# Patient Record
Sex: Female | Born: 1995 | Race: Black or African American | Hispanic: No | Marital: Single | Smoking: Former smoker
Health system: Southern US, Community
[De-identification: ages and names within clinical notes are randomized; demographics above are authoritative.]

## PROBLEM LIST (undated history)

## (undated) DIAGNOSIS — S83200A Bucket-handle tear of unspecified meniscus, current injury, right knee, initial encounter: Secondary | ICD-10-CM

## (undated) DIAGNOSIS — S83004A Unspecified dislocation of right patella, initial encounter: Secondary | ICD-10-CM

## (undated) DIAGNOSIS — S83511A Sprain of anterior cruciate ligament of right knee, initial encounter: Secondary | ICD-10-CM

## (undated) DIAGNOSIS — J45909 Unspecified asthma, uncomplicated: Secondary | ICD-10-CM

## (undated) DIAGNOSIS — T7840XA Allergy, unspecified, initial encounter: Secondary | ICD-10-CM

## (undated) HISTORY — DX: Allergy, unspecified, initial encounter: T78.40XA

---
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
## 2017-08-29 ENCOUNTER — Encounter (HOSPITAL_COMMUNITY): Payer: Self-pay | Admitting: *Deleted

## 2017-08-29 ENCOUNTER — Emergency Department (HOSPITAL_COMMUNITY)
Admission: EM | Admit: 2017-08-29 | Discharge: 2017-08-29 | Disposition: A | Discharge status: Home / Self Care | Payer: Self-pay | Attending: Emergency Medicine | Admitting: Emergency Medicine

## 2017-08-29 DIAGNOSIS — T148XXA Other injury of unspecified body region, initial encounter: Secondary | ICD-10-CM | POA: Insufficient documentation

## 2017-08-29 DIAGNOSIS — Y92009 Unspecified place in unspecified non-institutional (private) residence as the place of occurrence of the external cause: Secondary | ICD-10-CM | POA: Insufficient documentation

## 2017-08-29 DIAGNOSIS — Z79899 Other long term (current) drug therapy: Secondary | ICD-10-CM | POA: Insufficient documentation

## 2017-08-29 DIAGNOSIS — Z7722 Contact with and (suspected) exposure to environmental tobacco smoke (acute) (chronic): Secondary | ICD-10-CM | POA: Insufficient documentation

## 2017-08-29 DIAGNOSIS — Y999 Unspecified external cause status: Secondary | ICD-10-CM | POA: Insufficient documentation

## 2017-08-29 DIAGNOSIS — X501XXA Overexertion from prolonged static or awkward postures, initial encounter: Secondary | ICD-10-CM | POA: Insufficient documentation

## 2017-08-29 DIAGNOSIS — Y9384 Activity, sleeping: Secondary | ICD-10-CM | POA: Insufficient documentation

## 2017-08-29 DIAGNOSIS — J45909 Unspecified asthma, uncomplicated: Secondary | ICD-10-CM | POA: Insufficient documentation

## 2017-08-29 MED ORDER — IBUPROFEN 800 MG PO TABS
800.0000 mg | ORAL_TABLET | Freq: Once | ORAL | Status: AC
  Administered 2017-08-29: 800 mg via ORAL
  Filled 2017-08-29: qty 1

## 2017-08-29 MED ORDER — IBUPROFEN 600 MG PO TABS: 600.0000 mg | ORAL_TABLET | Freq: Three times a day (TID) | ORAL | 0 refills | Status: DC

## 2017-08-29 NOTE — ED Triage Notes (Signed)
Pt points to right collarbone area when asked where pain is.  Started last night.

## 2017-08-29 NOTE — ED Provider Notes (Signed)
[facility] EMERGENCY DEPARTMENT Provider Note   CSN: [id number] Arrival date & time: 08/29/17  1040     History   Chief Complaint Chief Complaint  Patient presents with  . Neck Pain    HPI Kelsey Abbott is a 22 y.o. female presenting with right clavicle pain which she woke with this am.  She reports sleeping on the couch last night and had soreness upon waking.  Her pain is gone at rest, worsens with palpation and with stretching her neck and opening her jaw.  She denies injury, has had no fevers, chills, sore throat, posterior neck or shoulder pain and denies sob or chest pain. She has had no treatment prior to arrival.  The history is provided by the patient.    Past Medical History:  Diagnosis Date  . Asthma    mother says asthma was ruled  out.  . Bucket-handle tear of meniscus of right knee as current injury 04/17/2016  . Closed dislocation of right patella 04/17/2016  . Complete tear of right ACL 04/17/2016    Patient Active Problem List   Diagnosis Date Noted  . Complete tear of right ACL 04/17/2016  . Closed dislocation of right patella 04/17/2016  . Bucket-handle tear of meniscus of right knee as current injury 04/17/2016  . Right ACL tear 04/17/2016  . Acne vulgaris 09/29/2014  . Acute upper respiratory infection 07/20/2014  . Panic attacks 04/25/2013  . SOB (shortness of breath) 03/23/2013  . 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 Care involving breathing exercises 03/23/2013  . Asthma, chronic 11/03/2012  . FLAT FOOT 07/05/2007    Past Surgical History:  Procedure Laterality Date  . KNEE ARTHROSCOPY WITH ANTERIOR CRUCIATE LIGAMENT (ACL) REPAIR WITH HAMSTRING GRAFT Right 04/17/2016   Procedure: Right KNEE ARTHROSCOPY WITH ANTERIOR CRUCIATE LIGAMENT (ACL) REPAIR WITH HAMSTRING GRAFT;  Surgeon: Teryl LucyJoshua Landau, MD;  Location: [facility];  Service: Orthopedics;  Laterality: Right;  Pre/Post Op femoral nerve block  . KNEE ARTHROSCOPY WITH MEDIAL  MENISECTOMY Right 04/17/2016   Procedure: KNEE ARTHROSCOPY WITH PARTIAL MEDIAL MENISECTOMY;  Surgeon: Teryl LucyJoshua Landau, MD;  Location: [facility];  Service: Orthopedics;  Laterality: Right;  . KNEE ARTHROSCOPY WITH MEDIAL PATELLAR FEMORAL LIGAMENT RECONSTRUCTION Right 04/17/2016   Procedure: KNEE ARTHROSCOPY WITH MEDIAL PATELLAR FEMORAL LIGAMENT REEFING;  Surgeon: Teryl LucyJoshua Landau, MD;  [facility];  Service: Orthopedics;  Laterality: Right;    OB History    No data available       Home Medications    Prior to Admission medications   Medication Sig Start Date End Date Taking? Authorizing Provider  baclofen (LIORESAL) 10 MG tablet Take 1 tablet (10 mg total) by mouth 3 (three) times daily. As needed for muscle spasm 04/17/16   Teryl LucyLandau, Joshua, MD  ibuprofen (ADVIL,MOTRIN) 600 MG tablet Take 1 tablet (600 mg total) by mouth 3 (three) times daily. 08/29/17   Burgess AmorIdol, Deandrea Rion, PA-C  norethindrone-ethinyl estradiol (CYCLAFEM,ALYACEN) 0.5/0.75/1-35 MG-MCG tablet Take 1 tablet by mouth daily.    [provider]  ondansetron (ZOFRAN) 4 MG tablet Take 1 tablet (4 mg total) by mouth every 8 (eight) hours as needed for nausea or vomiting. 04/17/16   Teryl LucyLandau, Joshua, MD  oxyCODONE-acetaminophen (ROXICET) 5-325 MG tablet Take 1-2 tablets by mouth every 6 (six) hours as needed for severe pain. 04/17/16   Teryl LucyLandau, Joshua, MD  sennosides-docusate sodium (SENOKOT-S) 8.6-50 MG tablet Take 2 tablets by mouth daily. 04/17/16   Teryl LucyLandau, Joshua, MD  Family History Family History  Problem Relation Age of Onset  . Hypertension Maternal Grandmother   . Hypertension Maternal Aunt     Social History Social History   Tobacco Use  . Smoking status: Passive Smoke Exposure - Never Smoker  . Smokeless tobacco: Never Used  Substance Use Topics  . Alcohol use: No    Comment: minor  . Drug use: No     Allergies   Patient has no known allergies.   Review of Systems Review of  Systems  Constitutional: Negative for fever.  Respiratory: Negative.   Cardiovascular: Negative.   Musculoskeletal: Positive for arthralgias. Negative for joint swelling, myalgias, neck pain and neck stiffness.  Skin: Negative for color change and wound.  Neurological: Negative for weakness and numbness.     Physical Exam Updated Vital Signs BP 116/77 (BP Location: Right Arm)   Pulse 71   Temp 97.9 F (36.6 C) (Oral)   Resp 18   Ht 5\' 4"  (1.626 m)   Wt 68 kg (150 lb)   LMP 08/28/2017   SpO2 100%   BMI 25.75 kg/m   Physical Exam  Constitutional: She appears well-developed and well-nourished.  HENT:  Head: Atraumatic.  Mouth/Throat: Uvula is midline and oropharynx is clear and moist. No trismus in the jaw. Normal dentition. No uvula swelling.  Sublingual space is soft  Neck: Normal range of motion.  Cardiovascular:  Pulses equal bilaterally  Pulmonary/Chest:  Point tender to palpation mid right clavicle.  There is no deformity, edema, erythema or any sign of trauma.  Patient has full range of motion of her shoulders without pain.  She has mild discomfort with full neck extension at the site.  There is no head or neck neck adenopathy.  Sublingual space is soft.    Musculoskeletal: She exhibits tenderness.  Neurological: She is alert. She has normal strength. She displays normal reflexes. No sensory deficit.  Skin: Skin is warm and dry.  Psychiatric: She has a normal mood and affect.     ED Treatments / Results  Labs (all labs ordered are listed, but only abnormal results are displayed) Labs Reviewed - No data to display  EKG  EKG Interpretation None       Radiology No results found.  Procedures Procedures (including critical care time)  Medications Ordered in ED Medications  ibuprofen (ADVIL,MOTRIN) tablet 800 mg (800 mg Oral Given 08/29/17 1126)     Initial Impression / Assessment and Plan / ED Course  I have reviewed the triage vital signs and the  nursing notes.  Pertinent labs & imaging results that were available during my care of the patient were reviewed by me and considered in my medical decision making (see chart for details).     Patient without injury, suspect she may have mild muscle strain possibly from sleeping awkwardly on the couch last night.  She has no exam findings to suggest infection, trauma.  She was advised heat therapy, ibuprofen, PRN follow-up with PCP if symptoms persist or worsen.  Final Clinical Impressions(s) / ED Diagnoses   Final diagnoses:  Muscle strain    ED Discharge Orders        Ordered    ibuprofen (ADVIL,MOTRIN) 600 MG tablet  3 times daily     08/29/17 1115       Burgess Amor, PA-C 08/29/17 1618    Mesner, Barbara Cower, MD 08/29/17 [phone]

## 2017-08-29 NOTE — Discharge Instructions (Signed)
Apply heating pad to your right neck in collar bone for 20 minutes several times daily.  Take the ibuprofen as prescribed.  I suspect you have strained a muscle in your neck, possibly from sleeping on the couch last night or from wearing your new pocket book over that shoulder.  Avoid this activity.  Plan to get rechecked if your symptoms worsen, but I suspect that this will resolve over the next several days.

## 2017-08-29 NOTE — ED Notes (Signed)
Patient with order to be given Ibuprofren 800mg  PO, patient stated she had already taken one 800 mg Advil brought from home.  Previous order not given.

## 2018-02-04 ENCOUNTER — Encounter (HOSPITAL_COMMUNITY): Payer: Self-pay | Admitting: *Deleted

## 2018-02-04 ENCOUNTER — Emergency Department (HOSPITAL_COMMUNITY)
Admission: EM | Admit: 2018-02-04 | Discharge: 2018-02-04 | Disposition: A | Discharge status: Home / Self Care | Payer: Self-pay | Attending: Emergency Medicine | Admitting: Emergency Medicine

## 2018-02-04 ENCOUNTER — Other Ambulatory Visit: Payer: Self-pay

## 2018-02-04 DIAGNOSIS — Z7722 Contact with and (suspected) exposure to environmental tobacco smoke (acute) (chronic): Secondary | ICD-10-CM | POA: Insufficient documentation

## 2018-02-04 DIAGNOSIS — R51 Headache: Secondary | ICD-10-CM | POA: Insufficient documentation

## 2018-02-04 DIAGNOSIS — R519 Headache, unspecified: Secondary | ICD-10-CM

## 2018-02-04 DIAGNOSIS — Z79899 Other long term (current) drug therapy: Secondary | ICD-10-CM | POA: Insufficient documentation

## 2018-02-04 MED ORDER — METOCLOPRAMIDE HCL 5 MG/ML IJ SOLN
10.0000 mg | Freq: Once | INTRAMUSCULAR | Status: AC
  Administered 2018-02-04: 10 mg via INTRAVENOUS
  Filled 2018-02-04: qty 2

## 2018-02-04 MED ORDER — BUTALBITAL-APAP-CAFFEINE 50-325-40 MG PO TABS: 1.0000 | ORAL_TABLET | Freq: Four times a day (QID) | ORAL | 0 refills | Status: AC | PRN

## 2018-02-04 MED ORDER — KETOROLAC TROMETHAMINE 30 MG/ML IJ SOLN
15.0000 mg | Freq: Once | INTRAMUSCULAR | Status: AC
  Administered 2018-02-04: 15 mg via INTRAVENOUS
  Filled 2018-02-04: qty 1

## 2018-02-04 MED ORDER — SODIUM CHLORIDE 0.9 % IV BOLUS
1000.0000 mL | Freq: Once | INTRAVENOUS | Status: AC
  Administered 2018-02-04: 1000 mL via INTRAVENOUS

## 2018-02-04 MED ORDER — DIPHENHYDRAMINE HCL 50 MG/ML IJ SOLN
25.0000 mg | Freq: Once | INTRAMUSCULAR | Status: AC
  Administered 2018-02-04: 25 mg via INTRAVENOUS
  Filled 2018-02-04: qty 1
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
## 2018-02-04 NOTE — ED Triage Notes (Signed)
Pt c/o right side headache x 1 week with some nausea; pt states she has a lump to that right shoulder

## 2018-02-16 ENCOUNTER — Encounter: Payer: Self-pay | Admitting: Women's Health

## 2018-02-16 ENCOUNTER — Ambulatory Visit (INDEPENDENT_AMBULATORY_CARE_PROVIDER_SITE_OTHER): Payer: Medicaid Other | Admitting: Women's Health

## 2018-02-16 ENCOUNTER — Other Ambulatory Visit: Payer: Self-pay

## 2018-02-16 VITALS — BP 131/80 | HR 86 | Ht 64.0 in | Wt 160.0 lb

## 2018-02-16 DIAGNOSIS — Z309 Encounter for contraceptive management, unspecified: Secondary | ICD-10-CM | POA: Diagnosis not present

## 2018-02-16 DIAGNOSIS — Z3202 Encounter for pregnancy test, result negative: Secondary | ICD-10-CM

## 2018-02-16 DIAGNOSIS — Z3009 Encounter for other general counseling and advice on contraception: Secondary | ICD-10-CM | POA: Diagnosis not present

## 2018-02-16 DIAGNOSIS — Z01419 Encounter for gynecological examination (general) (routine) without abnormal findings: Secondary | ICD-10-CM

## 2018-02-16 DIAGNOSIS — Z113 Encounter for screening for infections with a predominantly sexual mode of transmission: Secondary | ICD-10-CM

## 2018-02-16 LAB — POCT URINE PREGNANCY: PREG TEST UR: NEGATIVE

## 2018-02-16 MED ORDER — LO LOESTRIN FE 1 MG-10 MCG / 10 MCG PO TABS: 1.0000 | ORAL_TABLET | Freq: Every day | ORAL | 3 refills | Status: DC

## 2018-02-16 NOTE — Progress Notes (Signed)
   WELL-WOMAN EXAMINATION Patient name: Kelsey Abbott MRN [id number]  Date of birth: 01/13/1996 Chief Complaint:   Gynecologic Exam  History of Present Illness:   Kelsey Abbott is a 22 y.o. G0P0000 African American female being seen today for a routine FP Mcaid well-woman exam. Was originally here to get on birth control, but had to have physical w/ insurance.  Current complaints: had a period that was really crampy and passed a big clot, has been having unprotected sex, did not take a HPT. Wants to get back on COCs. Does not smoke, no h/o HTN, DVT/PE, CVA, MI, or migraines w/ aura.   PCP: none, was [facility]    Patient's last menstrual period was 02/13/2018. The current method of family planning is none and wants COCs Last pap <21yo. Results were: n/a Last mammogram: never. Results were: n/a Last colonoscopy: never. Results were: n/a  Review of Systems:   Pertinent items are noted in HPI Denies any headaches, blurred vision, fatigue, shortness of breath, chest pain, abdominal pain, abnormal vaginal discharge/itching/odor/irritation, problems with periods, bowel movements, urination, or intercourse unless otherwise stated above. Pertinent History Reviewed:  Reviewed past medical,surgical, social and family history.  Reviewed problem list, medications and allergies. Physical Assessment:   Vitals:   02/16/18 1051  BP: 131/80  Pulse: 86  Weight: 160 lb (72.6 kg)  Height: 5\' 4"  (1.626 m)  Body mass index is 27.46 kg/m.        Physical Examination:   General appearance - well appearing, and in no distress  Mental status - alert, oriented to person, place, and time  Psych:  She has a normal mood and affect  Skin - warm and dry, normal color, no suspicious lesions noted  Chest - effort normal, all lung fields clear to auscultation bilaterally  Heart - normal rate and regular rhythm  Neck:  midline trachea, no thyromegaly or nodules  Breasts - not assessed, denies  problems  Abdomen - soft, nontender, nondistended, no masses or organomegaly  Pelvic - VULVA: normal appearing vulva with no masses, tenderness or lesions  VAGINA: normal appearing vagina with normal color and discharge, no lesions  CERVIX: normal appearing cervix without discharge or lesions, no CMT  Thin prep pap is not done  UTERUS: uterus is felt to be normal size, shape, consistency and nontender   ADNEXA: No adnexal masses or tenderness noted.  Extremities:  No swelling or varicosities noted  Results for orders placed or performed in visit on 02/16/18 (from the past 24 hour(s))  POCT urine pregnancy   Collection Time: 02/16/18 11:27 AM  Result Value Ref Range   Preg Test, Ur Negative Negative    Assessment & Plan:  1) FP Mcaid Well-Woman Exam  2) Contraception management> rx LoLoestrin 3pk w/ 3RF, condoms always, f/u 3mths  3) STD screen> gc/ct, hiv, rpr  Labs/procedures today: std screen  Mammogram @20yo  or sooner if problems Colonoscopy @45 -50yo or sooner if problems  Orders Placed This Encounter  Procedures  . GC/Chlamydia Probe Amp  . HIV antibody  . RPR  . POCT urine pregnancy    Follow-up: Return in about 3 months (around 05/19/2018) for F/U.  Cheral MarkerKimberly R Leimomi Zervas CNM, [facility]NP-BC 02/16/2018 11:28 AM

## 2018-02-16 NOTE — Patient Instructions (Signed)
Condoms always!  Oral Contraception Use Oral contraceptive pills (OCPs) are medicines taken to prevent pregnancy. OCPs work by preventing the ovaries from releasing eggs. The hormones in OCPs also cause the cervical mucus to thicken, preventing the sperm from entering the uterus. The hormones also cause the uterine lining to become thin, not allowing a fertilized egg to attach to the inside of the uterus. OCPs are highly effective when taken exactly as prescribed. However, OCPs do not prevent sexually transmitted diseases (STDs). Safe sex practices, such as using condoms along with an OCP, can help prevent STDs. Before taking OCPs, you may have a physical exam and Pap test. Your health care provider may also order blood tests if necessary. Your health care provider will make sure you are a good candidate for oral contraception. Discuss with your health care provider the possible side effects of the OCP you may be prescribed. When starting an OCP, it can take 2 to 3 months for the body to adjust to the changes in hormone levels in your body. How to take oral contraceptive pills Your health care provider may advise you on how to start taking the first cycle of OCPs. Otherwise, you can:  Start on day 1 of your menstrual period. You will not need any backup contraceptive protection with this start time.  Start on the first Sunday after your menstrual period or the day you get your prescription. In these cases, you will need to use backup contraceptive protection for the first week.  Start the pill at any time of your cycle. If you take the pill within 5 days of the start of your period, you are protected against pregnancy right away. In this case, you will not need a backup form of birth control. If you start at any other time of your menstrual cycle, you will need to use another form of birth control for 7 days. If your OCP is the type called a minipill, it will protect you from pregnancy after taking it  for 2 days (48 hours).  After you have started taking OCPs:  If you forget to take 1 pill, take it as soon as you remember. Take the next pill at the regular time.  If you miss 2 or more pills, call your health care provider because different pills have different instructions for missed doses. Use backup birth control until your next menstrual period starts.  If you use a 28-day pack that contains inactive pills and you miss 1 of the last 7 pills (pills with no hormones), it will not matter. Throw away the rest of the non-hormone pills and start a new pill pack.  No matter which day you start the OCP, you will always start a new pack on that same day of the week. Have an extra pack of OCPs and a backup contraceptive method available in case you miss some pills or lose your OCP pack. Follow these instructions at home:  Do not smoke.  Always use a condom to protect against STDs. OCPs do not protect against STDs.  Use a calendar to mark your menstrual period days.  Read the information and directions that came with your OCP. Talk to your health care provider if you have questions. Contact a health care provider if:  You develop nausea and vomiting.  You have abnormal vaginal discharge or bleeding.  You develop a rash.  You miss your menstrual period.  You are losing your hair.  You need treatment for mood swings or  depression.  You get dizzy when taking the OCP.  You develop acne from taking the OCP.  You become pregnant. Get help right away if:  You develop chest pain.  You develop shortness of breath.  You have an uncontrolled or severe headache.  You develop numbness or slurred speech.  You develop visual problems.  You develop pain, redness, and swelling in the legs. This information is not intended to replace advice given to you by your health care provider. Make sure you discuss any questions you have with your health care provider. Document Released:  07/17/2011 Document Revised: 01/03/2016 Document Reviewed: 01/16/2013 Elsevier Interactive Patient Education  2017 [employer]

## 2018-02-17 LAB — RPR: RPR Ser Ql: NONREACTIVE

## 2018-02-17 LAB — HIV ANTIBODY (ROUTINE TESTING W REFLEX): HIV SCREEN 4TH GENERATION: NONREACTIVE

## 2018-02-18 LAB — GC/CHLAMYDIA PROBE AMP
Chlamydia trachomatis, NAA: NEGATIVE
NEISSERIA GONORRHOEAE BY PCR: NEGATIVE

## 2018-05-20 ENCOUNTER — Ambulatory Visit: Payer: Medicaid Other | Admitting: Women's Health

## 2018-05-27 ENCOUNTER — Ambulatory Visit (INDEPENDENT_AMBULATORY_CARE_PROVIDER_SITE_OTHER): Payer: Medicaid Other | Admitting: Women's Health

## 2018-05-27 ENCOUNTER — Encounter: Payer: Self-pay | Admitting: Women's Health

## 2018-05-27 VITALS — BP 119/74 | HR 76 | Ht 63.0 in | Wt 168.8 lb

## 2018-05-27 DIAGNOSIS — Z3041 Encounter for surveillance of contraceptive pills: Secondary | ICD-10-CM

## 2018-05-27 NOTE — Progress Notes (Signed)
   GYN VISIT Patient name: Kelsey Abbott MRN [id number]  Date of birth: 1996-01-05 Chief Complaint:   3 months follow-up (Lo Loestrin FE)  History of Present Illness:   Kelsey Abbott is a 22 y.o. G0P0000 African American female being seen today for f/u on LoLoestrin rx'd 02/16/18. Doing well, remembering to take pills, has normal period at end of pack. No problems/concerns.     Patient's last menstrual period was 05/23/2018. The current method of family planning is OCP (estrogen/progesterone). Last pap <21yo. Results were:  n/a Review of Systems:   Pertinent items are noted in HPI Denies fever/chills, dizziness, headaches, visual disturbances, fatigue, shortness of breath, chest pain, abdominal pain, vomiting, abnormal vaginal discharge/itching/odor/irritation, problems with periods, bowel movements, urination, or intercourse unless otherwise stated above.  Pertinent History Reviewed:  Reviewed past medical,surgical, social, obstetrical and family history.  Reviewed problem list, medications and allergies. Physical Assessment:   Vitals:   05/27/18 1018  BP: 119/74  Pulse: 76  Weight: 168 lb 12.8 oz (76.6 kg)  Height: 5\' 3"  (1.6 m)  Body mass index is 29.9 kg/m.       Physical Examination:   General appearance: alert, well appearing, and in no distress  Mental status: alert, oriented to person, place, and time  Skin: warm & dry   Cardiovascular: normal heart rate noted  Respiratory: normal respiratory effort, no distress  Abdomen: soft, non-tender   Pelvic: examination not indicated  Extremities: no edema   No results found for this or any previous visit (from the past 24 hour(s)).  Assessment & Plan:  1) Contraception management> continue LoLoestrin, f/u 65yr  Meds: No orders of the defined types were placed in this encounter.   No orders of the defined types were placed in this encounter.   Return for after 04/19/19 for , Pap & physical.  Cheral Marker CNM,  [facility] 05/27/2018 10:35 AM

## 2018-06-10 ENCOUNTER — Ambulatory Visit: Payer: Self-pay | Admitting: Physician Assistant

## 2018-06-15 ENCOUNTER — Ambulatory Visit: Payer: Self-pay | Admitting: Physician Assistant

## 2018-06-24 ENCOUNTER — Encounter: Payer: Self-pay | Admitting: Physician Assistant

## 2018-10-14 ENCOUNTER — Ambulatory Visit: Payer: Self-pay | Admitting: Advanced Practice Midwife

## 2019-07-23 ENCOUNTER — Other Ambulatory Visit: Payer: Self-pay

## 2019-07-23 ENCOUNTER — Emergency Department (HOSPITAL_COMMUNITY)
Admission: EM | Admit: 2019-07-23 | Discharge: 2019-07-24 | Disposition: A | Discharge status: Home / Self Care | Payer: Medicaid Other | Attending: Emergency Medicine | Admitting: Emergency Medicine

## 2019-07-23 ENCOUNTER — Encounter (HOSPITAL_COMMUNITY): Payer: Self-pay | Admitting: Emergency Medicine

## 2019-07-23 ENCOUNTER — Emergency Department (HOSPITAL_COMMUNITY): Payer: Medicaid Other

## 2019-07-23 DIAGNOSIS — Z5321 Procedure and treatment not carried out due to patient leaving prior to being seen by health care provider: Secondary | ICD-10-CM | POA: Insufficient documentation

## 2019-07-23 DIAGNOSIS — M542 Cervicalgia: Secondary | ICD-10-CM | POA: Diagnosis not present

## 2019-07-23 DIAGNOSIS — M25561 Pain in right knee: Secondary | ICD-10-CM | POA: Insufficient documentation

## 2019-07-23 DIAGNOSIS — S8991XA Unspecified injury of right lower leg, initial encounter: Secondary | ICD-10-CM | POA: Diagnosis not present
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
## 2019-12-22 ENCOUNTER — Encounter: Payer: Self-pay | Admitting: Family Medicine

## 2019-12-22 ENCOUNTER — Other Ambulatory Visit: Payer: Self-pay

## 2019-12-22 ENCOUNTER — Ambulatory Visit (INDEPENDENT_AMBULATORY_CARE_PROVIDER_SITE_OTHER): Payer: Medicaid Other | Admitting: Family Medicine

## 2019-12-22 VITALS — BP 138/68 | HR 95 | Temp 97.0°F | Ht 64.0 in | Wt 190.4 lb

## 2019-12-22 DIAGNOSIS — Z6832 Body mass index (BMI) 32.0-32.9, adult: Secondary | ICD-10-CM

## 2019-12-22 DIAGNOSIS — B9689 Other specified bacterial agents as the cause of diseases classified elsewhere: Secondary | ICD-10-CM

## 2019-12-22 DIAGNOSIS — R030 Elevated blood-pressure reading, without diagnosis of hypertension: Secondary | ICD-10-CM

## 2019-12-22 DIAGNOSIS — E6609 Other obesity due to excess calories: Secondary | ICD-10-CM

## 2019-12-22 DIAGNOSIS — J028 Acute pharyngitis due to other specified organisms: Secondary | ICD-10-CM | POA: Diagnosis not present

## 2019-12-22 MED ORDER — CEFDINIR 300 MG PO CAPS: 300.0000 mg | ORAL_CAPSULE | Freq: Two times a day (BID) | ORAL | 0 refills | Status: AC
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
## 2019-12-22 NOTE — Patient Instructions (Addendum)
I appreciate the opportunity to provide you with care for your health and wellness. Today we discussed: establish care  Follow up: 1 year for annual   No labs or referrals today   Today you are diagnosed with acute bacterial pharyngis -not related to strep. I have sent you in a medication for you to take as directed. Increase water intake  Call if you do not get better.   Please continue to practice social distancing to keep you, your family, and our community safe.  If you must go out, please wear a mask and practice good handwashing.  It was a pleasure to see you and I look forward to continuing to work together on your health and well-being. Please do not hesitate to call the office if you need care or have questions about your care.  Have a wonderful day and week. With Gratitude, Tereasa Coop, DNP, AGNP-BC

## 2019-12-29 ENCOUNTER — Encounter: Payer: Self-pay | Admitting: Family Medicine

## 2019-12-29 DIAGNOSIS — Z6832 Body mass index (BMI) 32.0-32.9, adult: Secondary | ICD-10-CM | POA: Insufficient documentation

## 2019-12-29 DIAGNOSIS — J028 Acute pharyngitis due to other specified organisms: Secondary | ICD-10-CM | POA: Insufficient documentation

## 2019-12-29 DIAGNOSIS — R030 Elevated blood-pressure reading, without diagnosis of hypertension: Secondary | ICD-10-CM | POA: Insufficient documentation

## 2019-12-29 NOTE — Assessment & Plan Note (Signed)
Possibly related to whitecoat syndrome but over the last year or so she has been sitting in the 120s over the 80s before.  We will keep an eye on this encourage her to eat a heart healthy diet and get exercise.

## 2019-12-29 NOTE — Assessment & Plan Note (Signed)
Strep negative in the office.  Diagnosed with acute bacterial pharyngitis not related to strep.  Omnicef ordered.

## 2019-12-29 NOTE — Assessment & Plan Note (Signed)
   Kelsey Abbott is re-educated about the importance of exercise daily to help with weight management. A minumum of 30 minutes daily is recommended. Additionally, importance of healthy food choices  with portion control discussed.

## 2020-01-11 ENCOUNTER — Ambulatory Visit (INDEPENDENT_AMBULATORY_CARE_PROVIDER_SITE_OTHER): Payer: Medicaid Other | Admitting: Internal Medicine

## 2020-01-11 ENCOUNTER — Other Ambulatory Visit: Payer: Self-pay

## 2020-01-11 ENCOUNTER — Encounter: Payer: Self-pay | Admitting: Internal Medicine

## 2020-01-11 ENCOUNTER — Ambulatory Visit: Payer: Medicaid Other | Admitting: Family Medicine

## 2020-01-11 VITALS — BP 120/86 | HR 74 | Temp 98.6°F | Resp 15 | Ht 64.0 in | Wt 185.1 lb

## 2020-01-11 DIAGNOSIS — Z6832 Body mass index (BMI) 32.0-32.9, adult: Secondary | ICD-10-CM | POA: Diagnosis not present

## 2020-01-11 DIAGNOSIS — E6609 Other obesity due to excess calories: Secondary | ICD-10-CM

## 2020-01-11 DIAGNOSIS — J392 Other diseases of pharynx: Secondary | ICD-10-CM | POA: Insufficient documentation

## 2020-01-11 NOTE — Patient Instructions (Signed)
Encourged to drink plenty of water Gargles with Warm water &  salt twice a day Tylenol as needed Watch calories intake Follow up for pap smear in 1 month.

## 2020-01-11 NOTE — Progress Notes (Signed)
Established Patient Office Visit  Subjective:  Patient ID: Kelsey Abbott, female    DOB: March 07, 1996  Age: 24 y.o. MRN: [id number]  CC:  Chief Complaint  Patient presents with  . Sore Throat    tonsils bothering her. She finished antibiotics prescribed to her.     HPI Kelsey Abbott is 24 year old female with past medical history of obesity presents to office for follow-up on sore throat.  Patient tells me that she came to our clinic 10 days ago and was diagnosed with acute bacterial pharyngitis and was prescribed Omnicef.  She tells me that she finished antibiotics as prescribed.  She is doing better however she continues to have throat irritation however denies association with headache, fever, chills, body ache, generalized weakness, runny nose, congestion, cough, shortness of breath, wheezing, nausea, vomiting, diarrhea, decreased appetite, dysphagia, urinary changes.  She tells me that she does not drink enough water.   Does not smoke cigarettes or use any illicit drugs however drinks alcohol occasionally.  She is due for Pap smear.  Past Medical History:  Diagnosis Date  . Acne vulgaris 09/29/2014  . Allergy   . Asthma    mother says asthma was ruled  out.  . Asthma, chronic 11/03/2012  . Bucket-handle tear of meniscus of right knee as current injury 04/17/2016  . Care involving breathing exercises 03/23/2013  . Closed dislocation of right patella 04/17/2016  . Complete tear of right ACL 04/17/2016  . FLAT FOOT 07/05/2007   Qualifier: Diagnosis of  By: Romeo Apple MD, Duffy Rhody    . 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 Panic attacks 04/25/2013  . Right ACL tear 04/17/2016  . SOB (shortness of breath) 03/23/2013    Past Surgical History:  Procedure Laterality Date  . KNEE ARTHROSCOPY WITH ANTERIOR CRUCIATE LIGAMENT (ACL) REPAIR WITH HAMSTRING GRAFT Right 04/17/2016   Procedure: Right KNEE ARTHROSCOPY WITH ANTERIOR CRUCIATE LIGAMENT (ACL) REPAIR WITH HAMSTRING GRAFT;  Surgeon: Teryl Lucy, MD;  Location: [facility];  Service: Orthopedics;  Laterality: Right;  Pre/Post Op femoral nerve block  . KNEE ARTHROSCOPY WITH MEDIAL MENISECTOMY Right 04/17/2016   Procedure: KNEE ARTHROSCOPY WITH PARTIAL MEDIAL MENISECTOMY;  Surgeon: Teryl Lucy, MD;  Location: [facility];  Service: Orthopedics;  Laterality: Right;  . KNEE ARTHROSCOPY WITH MEDIAL PATELLAR FEMORAL LIGAMENT RECONSTRUCTION Right 04/17/2016   Procedure: KNEE ARTHROSCOPY WITH MEDIAL PATELLAR FEMORAL LIGAMENT REEFING;  Surgeon: Teryl Lucy, MD;  Location: [facility];  Service: Orthopedics;  Laterality: Right;    Family History  Problem Relation Age of Onset  . Cancer Mother 13       breast cancer  . Hypertension Maternal Grandmother   . Hypertension Maternal Aunt     Social History   Socioeconomic History  . Marital status: Single    Spouse name: Not on file  . Number of children: Not on file  . Years of education: Not on file  . Highest education level: High school graduate  Occupational History  . Not on file  Tobacco Use  . Smoking status: Passive Smoke Exposure - Never Smoker  . Smokeless tobacco: Never Used  Substance and Sexual Activity  . Alcohol use: No  . Drug use: No  . Sexual activity: Yes  Other Topics Concern  . Not on file  Social History Narrative   Lives with Vickii Chafe       Enjoys: chill      Diet: eats all food outside seafood,  Caffeine: limited to none   Water: 6-8 cups daily      Wears seat belt   Does not use phone while driving    Smoke detectors at home   No guns       Social Determinants of Health   Financial Resource Strain: Low Risk   . Difficulty of Paying Living Expenses: Not hard at all  Food Insecurity: No Food Insecurity  . Worried About Programme researcher, broadcasting/film/video in the Last Year: Never true  . Ran Out of Food in the Last Year: Never true  Transportation Needs: No Transportation Needs  . Lack of Transportation  (Medical): No  . Lack of Transportation (Non-Medical): No  Physical Activity: Inactive  . Days of Exercise per Week: 0 days  . Minutes of Exercise per Session: 0 min  Stress: No Stress Concern Present  . Feeling of Stress : Not at all  Social Connections: Somewhat Isolated  . Frequency of Communication with Friends and Family: More than three times a week  . Frequency of Social Gatherings with Friends and Family: More than three times a week  . Attends Religious Services: Never  . Active Member of Clubs or Organizations: No  . Attends Banker Meetings: Never  . Marital Status: Living with partner  Intimate Partner Violence: Not At Risk  . Fear of Current or Ex-Partner: No  . Emotionally Abused: No  . Physically Abused: No  . Sexually Abused: No    No outpatient medications prior to visit.   No facility-administered medications prior to visit.    No Known Allergies  ROS Review of Systems  Constitutional: Negative.   HENT: Positive for sore throat.   Eyes: Negative.   Respiratory: Negative.   Cardiovascular: Negative.   Gastrointestinal: Negative.   Endocrine: Negative.   Genitourinary: Negative.   Musculoskeletal: Negative.   Allergic/Immunologic: Negative.   Neurological: Negative.   Hematological: Negative.   Psychiatric/Behavioral: Negative.       Objective:    Physical Exam  Constitutional: She is oriented to person, place, and time. She appears well-developed and well-nourished.  HENT:  Head: Normocephalic and atraumatic.  Right Ear: External ear normal.  Left Ear: External ear normal.  Nose: Nose normal.  Mouth/Throat: Oropharynx is clear and moist.  Bilateral tonsillar enlargement noted, no redness, no exudate noted.  Cardiovascular: Normal rate, regular rhythm, normal heart sounds and intact distal pulses.  Pulmonary/Chest: Effort normal and breath sounds normal.  Abdominal: Soft. Bowel sounds are normal.  Musculoskeletal:         General: Normal range of motion.     Cervical back: Normal range of motion and neck supple.  Neurological: She is alert and oriented to person, place, and time. She has normal reflexes.  Psychiatric: She has a normal mood and affect. Her behavior is normal. Judgment and thought content normal.    BP 120/86   Pulse 74   Temp 98.6 F (37 C) (Temporal)   Resp 15   Ht 5\' 4"  (1.626 m)   Wt 185 lb 1.9 oz (84 kg)   SpO2 99%   BMI 31.78 kg/m  Wt Readings from Last 3 Encounters:  01/11/20 185 lb 1.9 oz (84 kg)  12/22/19 190 lb 6.4 oz (86.4 kg)  05/27/18 168 lb 12.8 oz (76.6 kg)     Health Maintenance Due  Topic Date Due  . COVID-19 Vaccine (1) Never done  . CHLAMYDIA SCREENING  06/28/2016  . TETANUS/TDAP  04/18/2019  . PAP-Cervical  Cytology Screening  Never done  . PAP SMEAR-Modifier  Never done    There are no preventive care reminders to display for this patient.  No results found for: TSH No results found for: WBC, HGB, HCT, MCV, PLT Lab Results  Component Value Date   NA 140 11/30/2013   K 4.2 11/30/2013   CO2 27 11/30/2013   GLUCOSE 87 11/30/2013   BUN 8 11/30/2013   CREATININE 0.73 11/30/2013   CALCIUM 9.2 11/30/2013   No results found for: CHOL No results found for: HDL No results found for: LDLCALC No results found for: TRIG No results found for: CHOLHDL No results found for: HGBA1C    Assessment & Plan:   Problem List Items Addressed This Visit      Other   Class 1 obesity due to excess calories without serious comorbidity with body mass index (BMI) of 32.0 to 32.9 in adult - Primary   Throat irritation     Throat irritation: -Had acute bacterial pharyngitis 10 days ago.  Finished antibiotics Omnicef as prescribed.  Her symptoms are improving. -Encourage increase hydration.  Advised warm water gargles with salt twice a day. -Tylenol as needed. -Follow-up in 1 month-if continue to have throat irritation and tonsillar enlargement-will consider to refer  her to ENT   Obesity: BMI of 31.7 -Encouraged to watch her calories, exercise.  Follow-up in 1 month for screening Pap smear.   Follow-up: 1 month   Mckinley Jewel, MD

## 2020-02-23 ENCOUNTER — Encounter: Payer: Medicaid Other | Admitting: Internal Medicine

## 2020-02-27 DIAGNOSIS — M25561 Pain in right knee: Secondary | ICD-10-CM | POA: Diagnosis not present

## 2020-03-09 ENCOUNTER — Ambulatory Visit (INDEPENDENT_AMBULATORY_CARE_PROVIDER_SITE_OTHER): Payer: Medicaid Other | Admitting: Internal Medicine

## 2020-03-09 ENCOUNTER — Other Ambulatory Visit: Payer: Self-pay

## 2020-03-09 ENCOUNTER — Other Ambulatory Visit (HOSPITAL_COMMUNITY)
Admission: RE | Admit: 2020-03-09 | Discharge: 2020-03-09 | Disposition: A | Discharge status: Home / Self Care | Payer: BLUE CROSS/BLUE SHIELD | Source: Ambulatory Visit | Attending: Internal Medicine | Admitting: Internal Medicine

## 2020-03-09 ENCOUNTER — Encounter: Payer: Self-pay | Admitting: Internal Medicine

## 2020-03-09 VITALS — BP 111/76 | HR 80 | Resp 16 | Ht 64.0 in | Wt 188.0 lb

## 2020-03-09 DIAGNOSIS — Z0000 Encounter for general adult medical examination without abnormal findings: Secondary | ICD-10-CM

## 2020-03-09 DIAGNOSIS — Z01419 Encounter for gynecological examination (general) (routine) without abnormal findings: Secondary | ICD-10-CM

## 2020-03-09 DIAGNOSIS — Z708 Other sex counseling: Secondary | ICD-10-CM

## 2020-03-09 NOTE — Progress Notes (Signed)
Established Patient Office Visit  Subjective:  Patient ID: Kelsey Abbott, female    DOB: 1996/07/14  Age: 24 y.o. MRN: [id number]  CC:  Chief Complaint  Patient presents with  . Annual Exam    HPI Kelsey Abbott presents for annual physical exam and screening Pap smear.  She tells me that she is doing fine, denies any complaints or concerns.  She is sexually active with 1 female partner.  No history of STDs in the past.  She denies vaginal discharge.  Has regular cycles.  She tells me that her sore throat has resolved completely.  Does not smoke or use illicit drugs however drinks alcohol occasionally.  Denies headache, blurry vision, lightheadedness, dizziness, chest pain, shortness of breath, palpitation, leg swelling, fever, chills, cough, congestion, nausea, vomiting, urinary or bowel changes.  She tells me that she is working on her diet, exercise and weight loss.  Past Medical History:  Diagnosis Date  . Acne vulgaris 09/29/2014  . Allergy   . Asthma    mother says asthma was ruled  out.  . Asthma, chronic 11/03/2012  . Bucket-handle tear of meniscus of right knee as current injury 04/17/2016  . Care involving breathing exercises 03/23/2013  . Closed dislocation of right patella 04/17/2016  . Complete tear of right ACL 04/17/2016  . FLAT FOOT 07/05/2007   Qualifier: Diagnosis of  By: Romeo Apple MD, Duffy Rhody    . 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 Panic attacks 04/25/2013  . Right ACL tear 04/17/2016  . SOB (shortness of breath) 03/23/2013    Past Surgical History:  Procedure Laterality Date  . KNEE ARTHROSCOPY WITH ANTERIOR CRUCIATE LIGAMENT (ACL) REPAIR WITH HAMSTRING GRAFT Right 04/17/2016   Procedure: Right KNEE ARTHROSCOPY WITH ANTERIOR CRUCIATE LIGAMENT (ACL) REPAIR WITH HAMSTRING GRAFT;  Surgeon: Teryl Lucy, MD;  Location: [facility];  Service: Orthopedics;  Laterality: Right;  Pre/Post Op femoral nerve block  . KNEE ARTHROSCOPY WITH MEDIAL MENISECTOMY Right  04/17/2016   Procedure: KNEE ARTHROSCOPY WITH PARTIAL MEDIAL MENISECTOMY;  Surgeon: Teryl Lucy, MD;  Location: [facility];  Service: Orthopedics;  Laterality: Right;  . KNEE ARTHROSCOPY WITH MEDIAL PATELLAR FEMORAL LIGAMENT RECONSTRUCTION Right 04/17/2016   Procedure: KNEE ARTHROSCOPY WITH MEDIAL PATELLAR FEMORAL LIGAMENT REEFING;  Surgeon: Teryl Lucy, MD;  [facility];  Service: Orthopedics;  Laterality: Right;    Family History  Problem Relation Age of Onset  . Cancer Mother 45       breast cancer  . Hypertension Maternal Grandmother   . Hypertension Maternal Aunt     Social History   Socioeconomic History  . Marital status: Single    Spouse name: Not on file  . Number of children: Not on file  . Years of education: Not on file  . Highest education level: High school graduate  Occupational History  . Not on file  Tobacco Use  . Smoking status: Passive Smoke Exposure - Never Smoker  . Smokeless tobacco: Never Used  Vaping Use  . Vaping Use: Never used  Substance and Sexual Activity  . Alcohol use: No  . Drug use: No  . Sexual activity: Yes  Other Topics Concern  . Not on file  Social History Narrative   Lives with Vickii Chafe       Enjoys: chill      Diet: eats all food outside seafood,    Caffeine: limited to none   Water: 6-8 cups daily  Wears seat belt   Does not use phone while driving    Smoke detectors at home   No guns       Social Determinants of Health   Financial Resource Strain: Low Risk   . Difficulty of Paying Living Expenses: Not hard at all  Food Insecurity: No Food Insecurity  . Worried About Programme researcher, broadcasting/film/video in the Last Year: Never true  . Ran Out of Food in the Last Year: Never true  Transportation Needs: No Transportation Needs  . Lack of Transportation (Medical): No  . Lack of Transportation (Non-Medical): No  Physical Activity: Inactive  . Days of Exercise per Week: 0 days  . Minutes  of Exercise per Session: 0 min  Stress: No Stress Concern Present  . Feeling of Stress : Not at all  Social Connections: Moderately Isolated  . Frequency of Communication with Friends and Family: More than three times a week  . Frequency of Social Gatherings with Friends and Family: More than three times a week  . Attends Religious Services: Never  . Active Member of Clubs or Organizations: No  . Attends Banker Meetings: Never  . Marital Status: Living with partner  Intimate Partner Violence: Not At Risk  . Fear of Current or Ex-Partner: No  . Emotionally Abused: No  . Physically Abused: No  . Sexually Abused: No    Outpatient Medications Prior to Visit  Medication Sig Dispense Refill  . MOBIC 15 MG tablet Take 15 mg by mouth daily as needed.     No facility-administered medications prior to visit.    No Known Allergies  ROS Review of Systems  Constitutional: Negative.   HENT: Negative.   Eyes: Negative.   Respiratory: Negative.   Cardiovascular: Negative.   Gastrointestinal: Negative.   Endocrine: Negative.   Genitourinary: Negative.   Musculoskeletal: Negative.   Allergic/Immunologic: Negative.   Neurological: Negative.   Hematological: Negative.   Psychiatric/Behavioral: Negative.       Objective:    Physical Exam Constitutional:      Appearance: Normal appearance.  HENT:     Head: Normocephalic and atraumatic.     Nose: Nose normal.     Mouth/Throat:     Mouth: Mucous membranes are moist.  Eyes:     Extraocular Movements: Extraocular movements intact.     Conjunctiva/sclera: Conjunctivae normal.     Pupils: Pupils are equal, round, and reactive to light.  Cardiovascular:     Rate and Rhythm: Normal rate and regular rhythm.     Pulses: Normal pulses.     Heart sounds: Normal heart sounds.  Pulmonary:     Effort: Pulmonary effort is normal.     Breath sounds: Normal breath sounds.  Abdominal:     General: Abdomen is flat. Bowel sounds  are normal.     Palpations: Abdomen is soft.  Genitourinary:    Comments: GU: Mons pubis,Vulva/Labia Majora -no rashes, lesions Clitoris -no enlargement, piercing, non-tender Urethra -No prolapsed, discharge, non-tender Vagina -Rugated pink vaginal walls, no lesions, no discharge or blood, strong muscular tone Cervix -No erythema, copious amounts of white discharge present Uterus -Small, midline, firm, smooth, mobile, non-tender with movement Adnexae -small, no masses, non-tender bilaterally Musculoskeletal:        General: Normal range of motion.  Neurological:     General: No focal deficit present.     Mental Status: She is alert and oriented to person, place, and time.  Psychiatric:  Mood and Affect: Mood normal.        Behavior: Behavior normal.        Thought Content: Thought content normal.        Judgment: Judgment normal.     BP 111/76   Pulse 80   Resp 16   Ht 5\' 4"  (1.626 m)   Wt 188 lb 0.6 oz (85.3 kg)   SpO2 97%   BMI 32.28 kg/m  Wt Readings from Last 3 Encounters:  03/09/20 188 lb 0.6 oz (85.3 kg)  01/11/20 185 lb 1.9 oz (84 kg)  12/22/19 190 lb 6.4 oz (86.4 kg)     Health Maintenance Due  Topic Date Due  . Hepatitis C Screening  Never done  . COVID-19 Vaccine (1) Never done  . CHLAMYDIA SCREENING  06/28/2016  . TETANUS/TDAP  04/18/2019  . PAP-Cervical Cytology Screening  Never done  . PAP SMEAR-Modifier  Never done    There are no preventive care reminders to display for this patient.  No results found for: TSH No results found for: WBC, HGB, HCT, MCV, PLT Lab Results  Component Value Date   NA 140 11/30/2013   K 4.2 11/30/2013   CO2 27 11/30/2013   GLUCOSE 87 11/30/2013   BUN 8 11/30/2013   CREATININE 0.73 11/30/2013   CALCIUM 9.2 11/30/2013   No results found for: CHOL No results found for: HDL No results found for: LDLCALC No results found for: TRIG No results found for: CHOLHDL No results found for: 12/02/2013    Assessment & Plan:     Problem List Items Addressed This Visit    None    Visit Diagnoses    Encounter for annual physical exam    -  Primary   Encounter for cervical Pap smear with pelvic exam       Relevant Orders   Cytology - PAP   Morbid obesity (HCC)       Encounter for sexually transmitted disease counseling       Relevant Orders   Cervicovaginal ancillary only     Encounter for annual physical exam: -Patient is doing fine, denies any complaints -Encourage diet modification/exercise/weight loss. -Encourage increased fluid intake  Encounter for cervical Pap smear with pelvic exam: Pap smear done today along with STD work-up as per patient's request.  Obesity with BMI of 32 -Encourage exercise, weight loss and dietary modification and he verbalized understanding.   No orders of the defined types were placed in this encounter.   Follow-up: Return in about 3 months (around 06/09/2020).    06/11/2020, MD

## 2020-03-09 NOTE — Patient Instructions (Signed)
Follow up in 3 months Pap smear with STD work up done today Recommended Diet/exercise/diet modification

## 2020-03-13 LAB — CERVICOVAGINAL ANCILLARY ONLY
Bacterial Vaginitis (gardnerella): POSITIVE — AB
Candida Glabrata: NEGATIVE
Candida Vaginitis: NEGATIVE
Chlamydia: NEGATIVE
Comment: NEGATIVE
Comment: NEGATIVE
Comment: NEGATIVE
Comment: NEGATIVE
Comment: NEGATIVE
Comment: NORMAL
Neisseria Gonorrhea: NEGATIVE
Trichomonas: NEGATIVE

## 2020-03-13 LAB — CYTOLOGY - PAP
Comment: NEGATIVE
Diagnosis: NEGATIVE
High risk HPV: NEGATIVE

## 2020-03-14 ENCOUNTER — Other Ambulatory Visit: Payer: Self-pay | Admitting: Internal Medicine

## 2020-03-14 MED ORDER — METRONIDAZOLE 500 MG PO TABS: 500.0000 mg | ORAL_TABLET | Freq: Two times a day (BID) | ORAL | 0 refills | Status: DC

## 2020-03-19 ENCOUNTER — Other Ambulatory Visit: Payer: Self-pay | Admitting: *Deleted

## 2020-03-19 ENCOUNTER — Telehealth: Payer: Self-pay

## 2020-03-19 MED ORDER — METRONIDAZOLE 500 MG PO TABS: 500.0000 mg | ORAL_TABLET | Freq: Two times a day (BID) | ORAL | 0 refills | Status: DC

## 2020-03-19 MED ORDER — METRONIDAZOLE 500 MG PO TABS: 500.0000 mg | ORAL_TABLET | Freq: Two times a day (BID) | ORAL | 0 refills | Status: AC

## 2020-03-19 NOTE — Telephone Encounter (Signed)
Prescription printed will put in hannahs box for her to sign

## 2020-03-19 NOTE — Telephone Encounter (Signed)
Pt stated there was supposed to be an Rx going to [place] after her recent Pap results

## 2020-03-21 NOTE — Telephone Encounter (Signed)
This was sent to [facility] via fax 03-20-20

## 2020-04-05 ENCOUNTER — Ambulatory Visit (INDEPENDENT_AMBULATORY_CARE_PROVIDER_SITE_OTHER): Payer: Medicaid Other | Admitting: Family Medicine

## 2020-04-05 ENCOUNTER — Other Ambulatory Visit: Payer: Self-pay

## 2020-04-05 ENCOUNTER — Other Ambulatory Visit (HOSPITAL_COMMUNITY)
Admission: RE | Admit: 2020-04-05 | Discharge: 2020-04-05 | Disposition: A | Discharge status: Home / Self Care | Payer: BLUE CROSS/BLUE SHIELD | Source: Ambulatory Visit | Attending: Family Medicine | Admitting: Family Medicine

## 2020-04-05 ENCOUNTER — Other Ambulatory Visit (HOSPITAL_COMMUNITY)
Admission: RE | Admit: 2020-04-05 | Discharge: 2020-04-05 | Disposition: A | Discharge status: Home / Self Care | Payer: Medicaid Other | Source: Ambulatory Visit | Attending: Adult Health | Admitting: Adult Health

## 2020-04-05 ENCOUNTER — Encounter: Payer: Self-pay | Admitting: Family Medicine

## 2020-04-05 VITALS — BP 112/78 | HR 73 | Temp 97.3°F | Resp 18 | Ht 64.0 in | Wt 183.4 lb

## 2020-04-05 DIAGNOSIS — N760 Acute vaginitis: Secondary | ICD-10-CM | POA: Insufficient documentation

## 2020-04-05 DIAGNOSIS — Z23 Encounter for immunization: Secondary | ICD-10-CM | POA: Diagnosis not present

## 2020-04-05 LAB — POCT URINALYSIS DIP (CLINITEK)
Bilirubin, UA: NEGATIVE
Blood, UA: NEGATIVE
Glucose, UA: NEGATIVE mg/dL
Ketones, POC UA: NEGATIVE mg/dL
Nitrite, UA: NEGATIVE
POC PROTEIN,UA: NEGATIVE
Spec Grav, UA: 1.025 (ref 1.010–1.025)
Urobilinogen, UA: 0.2 E.U./dL
pH, UA: 6 (ref 5.0–8.0)

## 2020-04-05 MED ORDER — FLUCONAZOLE 150 MG PO TABS: 150.0000 mg | ORAL_TABLET | Freq: Every day | ORAL | 0 refills | Status: DC

## 2020-04-05 NOTE — Patient Instructions (Signed)
I appreciate the opportunity to provide you with care for your health and wellness. Today we discussed: yeast infection  Follow up: 06/12/2020 as scheduled  No labs or referrals today  Sending out urine to get culture check for infection  Treatment for yeast infection today  Please continue to practice social distancing to keep you, your family, and our community safe.  If you must go out, please wear a mask and practice good handwashing.  It was a pleasure to see you and I look forward to continuing to work together on your health and well-being. Please do not hesitate to call the office if you need care or have questions about your care.  Have a wonderful day and week. With Gratitude, Tereasa Coop, DNP, AGNP-BC

## 2020-04-05 NOTE — Progress Notes (Signed)
Subjective:  Patient ID: Kelsey Abbott, female    DOB: 18-Jan-1996  Age: 24 y.o. MRN: [id number]  CC:  Chief Complaint  Patient presents with  . Vaginitis    pt thinks she has yeast infection has been itching and having discharge since monday has not tried anything over the counter       HPI  HPI  Kelsey Abbott is a 24 year old female patient who presents today with questionable yeast infection.  She reports that about a week ago she had intercourse and in the last 3 days she has had increasing white creamy discharge, vaginal itching and discomfort.  She denies having any exposure to STD and or bleeding.  She denies having any nausea or vomiting, abdominal pain, pelvic pressure.  Any changes in bowel or bladder habits.  Also would like a flu shot today.  Today patient denies signs and symptoms of COVID 19 infection including fever, chills, cough, shortness of breath, and headache. Past Medical, Surgical, Social History, Allergies, and Medications have been Reviewed.   Past Medical History:  Diagnosis Date  . Acne vulgaris 09/29/2014  . Allergy   . Asthma    mother says asthma was ruled  out.  . Asthma, chronic 11/03/2012  . Bucket-handle tear of meniscus of right knee as current injury 04/17/2016  . Care involving breathing exercises 03/23/2013  . Closed dislocation of right patella 04/17/2016  . Complete tear of right ACL 04/17/2016  . FLAT FOOT 07/05/2007   Qualifier: Diagnosis of  By: Romeo Apple MD, Duffy Rhody    . 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 Panic attacks 04/25/2013  . Right ACL tear 04/17/2016  . SOB (shortness of breath) 03/23/2013    Current Meds  Medication Sig  . MOBIC 15 MG tablet Take 15 mg by mouth daily as needed.    ROS:  Review of Systems  Constitutional: Negative.   HENT: Negative.   Eyes: Negative.   Respiratory: Negative.   Cardiovascular: Negative.   Gastrointestinal: Negative.   Genitourinary:       Vaginal itching  Musculoskeletal: Negative.     Skin: Negative.   Neurological: Negative.   Endo/Heme/Allergies: Negative.   Psychiatric/Behavioral: Negative.      Objective:   Today's Vitals: BP 112/78 (BP Location: Right Arm, Patient Position: Sitting, Cuff Size: Normal)   Pulse 73   Temp (!) 97.3 F (36.3 C) (Temporal)   Resp 18   Ht 5\' 4"  (1.626 m)   Wt 183 lb 6.4 oz (83.2 kg)   SpO2 99%   BMI 31.48 kg/m  Vitals with BMI 04/05/2020 03/09/2020 01/11/2020  Height 5\' 4"  5\' 4"  5\' 4"   Weight 183 lbs 6 oz 188 lbs 1 oz 185 lbs 2 oz  BMI 31.47 32.26 31.76  Systolic 112 111 03/12/2020  Diastolic 78 76 86  Pulse 73 80 74     Physical Exam Vitals and nursing note reviewed.  Constitutional:      Appearance: Normal appearance. She is well-developed and well-groomed. She is obese.  HENT:     Head: Normocephalic and atraumatic.     Right Ear: External ear normal.     Left Ear: External ear normal.     Mouth/Throat:     Comments: Mask in place Eyes:     General:        Right eye: No discharge.        Left eye: No discharge.     Conjunctiva/sclera: Conjunctivae normal.  Cardiovascular:  Rate and Rhythm: Normal rate and regular rhythm.     Pulses: Normal pulses.     Heart sounds: Normal heart sounds.  Pulmonary:     Effort: Pulmonary effort is normal.     Breath sounds: Normal breath sounds.  Musculoskeletal:        General: Normal range of motion.     Cervical back: Normal range of motion and neck supple.  Skin:    General: Skin is warm.  Neurological:     General: No focal deficit present.     Mental Status: She is alert and oriented to person, place, and time.  Psychiatric:        Attention and Perception: Attention normal.        Mood and Affect: Mood normal.        Speech: Speech normal.        Behavior: Behavior normal. Behavior is cooperative.        Thought Content: Thought content normal.        Cognition and Memory: Cognition normal.        Judgment: Judgment normal.      Assessment   1. Acute  vaginitis   2. Need for immunization against influenza     Tests ordered Orders Placed This Encounter  Procedures  . Urine Culture  . Flu Vaccine QUAD 36+ mos IM  . POCT URINALYSIS DIP (CLINITEK)     Plan: Please see assessment and plan per problem list above.   Meds ordered this encounter  Medications  . fluconazole (DIFLUCAN) 150 MG tablet    Sig: Take 1 tablet (150 mg total) by mouth daily.    Dispense:  1 tablet    Refill:  0    Order Specific Question:   Supervising Provider    Answer:   Genia Harold    Patient to follow-up in 06/12/2020  Freddy Finner, NP

## 2020-04-06 DIAGNOSIS — Z23 Encounter for immunization: Secondary | ICD-10-CM | POA: Insufficient documentation

## 2020-04-06 DIAGNOSIS — N760 Acute vaginitis: Secondary | ICD-10-CM | POA: Insufficient documentation

## 2020-04-06 LAB — URINE CYTOLOGY ANCILLARY ONLY
Bacterial Vaginitis-Urine: NEGATIVE
Candida Urine: POSITIVE — AB

## 2020-04-06 LAB — URINE CULTURE

## 2020-04-06 NOTE — Assessment & Plan Note (Signed)
WBCs on urine dip.  Will be sending out just to make sure is not just yeast.  If needed will treat if urinary tract infection comes back positive.  At this time will treat for yeast with the use of Diflucan. Patient acknowledged agreement and understanding of the plan.   Reviewed side effects, risks and benefits of medication.

## 2020-04-06 NOTE — Assessment & Plan Note (Signed)

## 2020-06-12 ENCOUNTER — Ambulatory Visit: Payer: Medicaid Other | Admitting: Family Medicine

## 2020-08-14 ENCOUNTER — Other Ambulatory Visit: Payer: Self-pay | Admitting: Family Medicine

## 2020-08-14 ENCOUNTER — Encounter: Payer: Self-pay | Admitting: Family Medicine

## 2020-08-14 DIAGNOSIS — Z319 Encounter for procreative management, unspecified: Secondary | ICD-10-CM

## 2020-09-04 ENCOUNTER — Ambulatory Visit (INDEPENDENT_AMBULATORY_CARE_PROVIDER_SITE_OTHER): Payer: BC Managed Care – PPO | Admitting: Adult Health

## 2020-09-04 ENCOUNTER — Encounter: Payer: Self-pay | Admitting: Adult Health

## 2020-09-04 ENCOUNTER — Other Ambulatory Visit: Payer: Self-pay

## 2020-09-04 VITALS — BP 123/79 | HR 77 | Ht 64.0 in | Wt 193.5 lb

## 2020-09-04 DIAGNOSIS — Z319 Encounter for procreative management, unspecified: Secondary | ICD-10-CM | POA: Diagnosis not present

## 2020-09-04 MED ORDER — PRENATAL PLUS 27-1 MG PO TABS: 1.0000 | ORAL_TABLET | Freq: Every day | ORAL | 12 refills | Status: DC

## 2020-09-04 NOTE — Patient Instructions (Signed)
Preparing for Pregnancy If you are planning to become pregnant, talk to your health care provider about preconception care. This type of care helps you prepare for a safe and healthy pregnancy. During this visit, your health care provider will:  Do a complete physical exam, including a Pap test.  Take your complete medical history.  Give you information, answer your questions, and help you resolve problems. Preconception checklist Medical history  Tell your health care provider about any medical conditions you have or have had. Your pregnancy or your ability to become pregnant may be affected by long-term (chronic) conditions, such as: ? Diabetes. ? High blood pressure (hypertension). ? Thyroid problems.  Tell your health care provider about your family's medical history and your partner's medical history.   Tell your health care provider if you have or have had any sexually transmitted infections, or STIs. These can affect your pregnancy. In some cases, they can be passed to your baby.  If needed, discuss the benefits of genetic testing. This test checks for conditions that may be passed from parent to child.  Tell your health care provider about: ? Any problems you had getting pregnant or while pregnant. ? Any medicines you take. These include vitamins, herbal supplements, and over-the-counter medicines. ? Your history of getting vaccines. Discuss any vaccines that you may need. Diet  Ask your health care provider about what foods to eat in order to get a balance of nutrients. This is especially important when you are pregnant or preparing to become pregnant. It is recommended that women of childbearing age take a folic acid supplement of 400 mcg daily and eat foods rich in folic acid to prevent certain birth defects.  Ask your health care provider to help you reach a healthy weight before pregnancy. ? If you are overweight, you may have a higher risk for certain problems. These  include hypertension, diabetes, and early (preterm) birth. ? If you are underweight, you are more likely to have a baby who has a low birth weight. Lifestyle, work, and home Let your health care provider know about:  Any lifestyle habits that you have, such as use of alcohol, drugs, or tobacco products.  Fun and leisure activities that may put you at risk during pregnancy, such as downhill skiing and certain exercise programs.  Any plans to travel out of the country, especially to places with an active Zika virus outbreak.  Harmful substances that you may be exposed to at work or at home. These include chemicals, pesticides, radiation, and substances from cat litter boxes.  Any concerns you have for your safety at home. Mental health Tell your health care provider about:  Any history of mental health conditions, including feelings of depression, sadness, or anxiety.  Any medicines that you take for a mental health condition. These include herbs and supplements. How do I know that I am pregnant? You may be pregnant if you have been sexually active and you miss your period. Other symptoms of early pregnancy include:  Mild cramping.  Very light vaginal bleeding (spotting).  Feeling more tired than usual.  Nausea and vomiting. These may be signs of morning sickness. Take a home pregnancy test if you have any of these symptoms. This test checks for a hormone in your urine called human chorionic gonadotropin, or hCG. A woman's body begins to make this hormone during early pregnancy. These tests are very accurate. Wait until at least the first day after you miss your period to take a   home pregnancy test. If the test shows that you are pregnant, call your health care provider for a prenatal care visit. What should I do if I become pregnant?  Schedule a visit with your health care provider as soon as you suspect you are pregnant.  Talk to your health care provider if you are taking  prescription medicines to determine if they are safe to take during pregnancy.  You may continue to have sex if it does not cause pain or other problems, such as vaginal bleeding. Follow these instructions at home: Eating and drinking  Follow instructions from your health care provider about eating or drinking restrictions.  Drink enough fluid to keep your urine pale yellow.  Eat a balanced diet. This includes fresh fruits and vegetables, whole grains, lean meats, low-fat dairy products, healthy fats, and foods that are high in fiber. Ask to meet with a nutritionist or registered dietitian for help with meal planning and goals.  Avoid eating raw or undercooked meat and seafood.  Avoid eating or drinking unpasteurized dairy products.   Lifestyle  Get regular exercise. Try to be active for at least 30 minutes a day on most days of the week. Ask your health care provider which activities are safe during pregnancy.  Maintain a healthy weight.  Avoid toxic fumes and chemicals.  Avoid cleaning cat litter boxes. Cat feces may contain a harmful parasite called toxoplasma.  Avoid travel to countries where Zika virus is common.  Do not use any products that contain nicotine or tobacco, such as cigarettes, e-cigarettes, and chewing tobacco. If you need help quitting, ask your health care provider.  Do not drink alcohol or use drugs.      General instructions  Keep an accurate record of your menstrual periods. This makes it easier for your health care provider to determine your baby's due date.  Take over-the-counter and prescription medicines only as told by your health care provider.  Begin taking prenatal vitamins and folic acid supplements daily as directed.  Manage any chronic conditions, such as hypertension and diabetes, as told by your health care provider. This is important. Summary  If you are planning to become pregnant, talk to your health care provider about preconception  care. This is an important part of planning for a healthy pregnancy.  Women of childbearing age should take 400 mcg of folic acid daily in addition to eating a diet rich in folic acid. This will prevent certain birth defects.  Schedule a visit with your health care provider as soon as you suspect you are pregnant. Tell your health care provider about your medical history, lifestyle activities, home safety, and other things that may concern you. This information is not intended to replace advice given to you by your health care provider. Make sure you discuss any questions you have with your health care provider. Document Revised: 04/27/2019 Document Reviewed: 04/27/2019 Elsevier Patient Education  2021 Elsevier Inc.  

## 2020-09-04 NOTE — Progress Notes (Addendum)
  Subjective:     Patient ID: Kelsey Abbott, female   DOB: Jan 10, 1996, 25 y.o.   MRN: [id number]  HPI Nisreen is a 25 year old black female, engaged, getting married in April, G0P0, in to discuss getting pregnant. Normal pap with negative HPV 03/09/20. PCP is Tereasa Coop NP.   Review of Systems Periods regular Has been trying about 4-5 months to get pregnant Reviewed past medical,surgical, social and family history. Reviewed medications and allergies.     Objective:   Physical Exam BP 123/79 (BP Location: Right Arm, Patient Position: Sitting, Cuff Size: Large)   Pulse 77   Ht 5\' 4"  (1.626 m)   Wt 193 lb 8 oz (87.8 kg)   LMP 08/12/2020   BMI 33.21 kg/m  Skin warm and dry. Neck: mid line trachea, normal thyroid, good ROM, no lymphadenopathy noted. Lungs: clear to ausculation bilaterally. Cardiovascular: regular rate and rhythm. AA is 1 Fall risk is low PHQ 9 score is 3 GAD 7 score is 3  Upstream - 09/04/20 1007      Pregnancy Intention Screening   Does the patient want to become pregnant in the next year? Yes    Does the patient's partner want to become pregnant in the next year? Yes    Would the patient like to discuss contraceptive options today? No      Contraception Wrap Up   Current Method Pregnant/Seeking Pregnancy    End Method Pregnant/Seeking Pregnancy    Contraception Counseling Provided No             Assessment:    1. Patient desires pregnancy Will rx PNV Meds ordered this encounter  Medications  . prenatal vitamin w/FE, FA (PRENATAL 1 + 1) 27-1 MG TABS tablet    Sig: Take 1 tablet by mouth daily at 12 noon.    Dispense:  30 tablet    Refill:  12    Order Specific Question:   Supervising Provider    Answer:   09/06/20 [[id number]]      Plan:     Call with next period will check progesterone level day 21  Discussed timing of sex, every other day days 7-24, pee before and lay there 30 minutes after  Get COVID booster, and Tdap Review handout on  preparing for pregnancy, can take 6-18 months of active trying Discussed clomid use and possible SA  Try to lose 15-20 lbs

## 2020-09-10 ENCOUNTER — Other Ambulatory Visit: Payer: Self-pay | Admitting: Adult Health

## 2020-09-10 DIAGNOSIS — Z319 Encounter for procreative management, unspecified: Secondary | ICD-10-CM

## 2020-09-10 NOTE — Progress Notes (Signed)
Ck progesterone level 2/18

## 2020-09-29 LAB — PROGESTERONE: Progesterone: 9.5 ng/mL

## 2020-10-04 ENCOUNTER — Other Ambulatory Visit: Payer: Self-pay | Admitting: Adult Health

## 2020-10-04 DIAGNOSIS — Z319 Encounter for procreative management, unspecified: Secondary | ICD-10-CM

## 2020-10-04 NOTE — Progress Notes (Signed)
Ck progesterone level 3/16,order is in

## 2020-10-24 DIAGNOSIS — Z319 Encounter for procreative management, unspecified: Secondary | ICD-10-CM | POA: Diagnosis not present

## 2020-10-25 LAB — PROGESTERONE: Progesterone: 7.3 ng/mL

## 2020-10-29 ENCOUNTER — Other Ambulatory Visit: Payer: Self-pay | Admitting: Adult Health

## 2020-10-29 DIAGNOSIS — Z319 Encounter for procreative management, unspecified: Secondary | ICD-10-CM

## 2020-10-29 MED ORDER — CLOMIPHENE CITRATE 50 MG PO TABS: ORAL_TABLET | ORAL | 2 refills | Status: DC

## 2020-10-29 NOTE — Progress Notes (Signed)
Will rx clomid Ck progesterone level 03/18/21

## 2020-10-30 ENCOUNTER — Encounter: Payer: Self-pay | Admitting: Family Medicine

## 2020-10-31 ENCOUNTER — Encounter: Payer: Medicaid Other | Admitting: Internal Medicine

## 2020-10-31 ENCOUNTER — Encounter: Payer: Self-pay | Admitting: Internal Medicine

## 2020-10-31 ENCOUNTER — Other Ambulatory Visit: Payer: Self-pay

## 2020-11-01 NOTE — Progress Notes (Signed)
Patient was contacted twice for a televisit with no response. LVM to contact if she needs to discuss her concerns.  Trena Platt, MD

## 2020-12-21 ENCOUNTER — Encounter: Payer: Medicaid Other | Admitting: Family Medicine

## 2021-01-13 ENCOUNTER — Encounter: Payer: Self-pay | Admitting: Family Medicine

## 2021-01-14 ENCOUNTER — Encounter: Payer: Self-pay | Admitting: Nurse Practitioner

## 2021-01-14 ENCOUNTER — Telehealth: Payer: Medicaid Other | Admitting: Nurse Practitioner

## 2021-01-14 ENCOUNTER — Other Ambulatory Visit: Payer: Self-pay

## 2021-01-14 DIAGNOSIS — J392 Other diseases of pharynx: Secondary | ICD-10-CM | POA: Diagnosis not present

## 2021-01-14 MED ORDER — AMOXICILLIN-POT CLAVULANATE 875-125 MG PO TABS: 1.0000 | ORAL_TABLET | Freq: Two times a day (BID) | ORAL | 0 refills | Status: DC

## 2021-01-14 MED ORDER — IBUPROFEN 600 MG PO TABS: 600.0000 mg | ORAL_TABLET | Freq: Three times a day (TID) | ORAL | 0 refills | Status: DC | PRN

## 2021-01-14 NOTE — Progress Notes (Signed)
Acute Office Visit  Subjective:    Patient ID: Kelsey Abbott, female    DOB: 1996/04/11, 25 y.o.   MRN: [id number]  Chief Complaint  Patient presents with  . Sore Throat    Sore throat ear ache has been going on for about week and half she has tried nyquil but this is not helping     HPI Patient is in today for sick visit.  Past Medical History:  Diagnosis Date  . Acne vulgaris 09/29/2014  . Allergy   . Asthma    mother says asthma was ruled  out.  . Asthma, chronic 11/03/2012  . Bucket-handle tear of meniscus of right knee as current injury 04/17/2016  . Care involving breathing exercises 03/23/2013  . Closed dislocation of right patella 04/17/2016  . Complete tear of right ACL 04/17/2016  . FLAT FOOT 07/05/2007   Qualifier: Diagnosis of  By: Romeo Apple MD, Duffy Rhody    . 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 Panic attacks 04/25/2013  . Right ACL tear 04/17/2016  . SOB (shortness of breath) 03/23/2013    Past Surgical History:  Procedure Laterality Date  . KNEE ARTHROSCOPY WITH ANTERIOR CRUCIATE LIGAMENT (ACL) REPAIR WITH HAMSTRING GRAFT Right 04/17/2016   Procedure: Right KNEE ARTHROSCOPY WITH ANTERIOR CRUCIATE LIGAMENT (ACL) REPAIR WITH HAMSTRING GRAFT;  Surgeon: Teryl Lucy, MD;  Location: [facility];  Service: Orthopedics;  Laterality: Right;  Pre/Post Op femoral nerve block  . KNEE ARTHROSCOPY WITH MEDIAL MENISECTOMY Right 04/17/2016   Procedure: KNEE ARTHROSCOPY WITH PARTIAL MEDIAL MENISECTOMY;  Surgeon: Teryl Lucy, MD;  Location: [facility];  Service: Orthopedics;  Laterality: Right;  . KNEE ARTHROSCOPY WITH MEDIAL PATELLAR FEMORAL LIGAMENT RECONSTRUCTION Right 04/17/2016   Procedure: KNEE ARTHROSCOPY WITH MEDIAL PATELLAR FEMORAL LIGAMENT REEFING;  Surgeon: Teryl Lucy, MD;  Location: [facility];  Service: Orthopedics;  Laterality: Right;    Family History  Problem Relation Age of Onset  . Cancer Mother 51       breast cancer  .  Hypertension Maternal Grandmother   . Hypertension Maternal Aunt     Social History   Socioeconomic History  . Marital status: Single    Spouse name: Not on file  . Number of children: Not on file  . Years of education: Not on file  . Highest education level: High school graduate  Occupational History  . Not on file  Tobacco Use  . Smoking status: Former Smoker    Types: Cigars  . Smokeless tobacco: Never Used  Vaping Use  . Vaping Use: Never used  Substance and Sexual Activity  . Alcohol use: No  . Drug use: No  . Sexual activity: Yes    Birth control/protection: None  Other Topics Concern  . Not on file  Social History Narrative   Lives with Vickii Chafe       Enjoys: chill      Diet: eats all food outside seafood,    Caffeine: limited to none   Water: 6-8 cups daily      Wears seat belt   Does not use phone while driving    Smoke detectors at home   No guns       Social Determinants of Health   Financial Resource Strain: Low Risk   . Difficulty of Paying Living Expenses: Not very hard  Food Insecurity: No Food Insecurity  . Worried About Programme researcher, broadcasting/film/video in the Last Year: Never true  . Ran Out  of Food in the Last Year: Never true  Transportation Needs: No Transportation Needs  . Lack of Transportation (Medical): No  . Lack of Transportation (Non-Medical): No  Physical Activity: Insufficiently Active  . Days of Exercise per Week: 2 days  . Minutes of Exercise per Session: 30 min  Stress: No Stress Concern Present  . Feeling of Stress : Not at all  Social Connections: Moderately Integrated  . Frequency of Communication with Friends and Family: More than three times a week  . Frequency of Social Gatherings with Friends and Family: More than three times a week  . Attends Religious Services: 1 to 4 times per year  . Active Member of Clubs or Organizations: No  . Attends Banker Meetings: Never  . Marital Status: Living with partner   Intimate Partner Violence: Not At Risk  . Fear of Current or Ex-Partner: No  . Emotionally Abused: No  . Physically Abused: No  . Sexually Abused: No    Outpatient Medications Prior to Visit  Medication Sig Dispense Refill  . prenatal vitamin w/FE, FA (PRENATAL 1 + 1) 27-1 MG TABS tablet Take 1 tablet by mouth daily at 12 noon. 30 tablet 12  . clomiPHENE (CLOMID) 50 MG tablet Take 1 day 3-7 of cycle 5 tablet 2   No facility-administered medications prior to visit.    No Known Allergies  Review of Systems  Constitutional: Negative for chills, fatigue and fever.  HENT: Positive for ear pain, sneezing and sore throat. Negative for congestion, postnasal drip, rhinorrhea, sinus pressure and sinus pain.   Respiratory: Positive for cough. Negative for chest tightness, shortness of breath and wheezing.        Objective:    Physical Exam  There were no vitals taken for this visit. Wt Readings from Last 3 Encounters:  09/04/20 193 lb 8 oz (87.8 kg)  04/05/20 183 lb 6.4 oz (83.2 kg)  03/09/20 188 lb 0.6 oz (85.3 kg)    Health Maintenance Due  Topic Date Due  . HPV VACCINES (2 - 3-dose series) 07/27/2015  . Hepatitis C Screening  Never done  . TETANUS/TDAP  04/18/2019  . COVID-19 Vaccine (2 - Moderna 3-dose series) 04/23/2020       Topic Date Due  . HPV VACCINES (2 - 3-dose series) 07/27/2015     No results found for: TSH No results found for: WBC, HGB, HCT, MCV, PLT Lab Results  Component Value Date   NA 140 11/30/2013   K 4.2 11/30/2013   CO2 27 11/30/2013   GLUCOSE 87 11/30/2013   BUN 8 11/30/2013   CREATININE 0.73 11/30/2013   CALCIUM 9.2 11/30/2013   No results found for: CHOL No results found for: HDL No results found for: LDLCALC No results found for: TRIG No results found for: CHOLHDL No results found for: RUEA5W     Assessment & Plan:   Problem List Items Addressed This Visit      Other   Throat irritation - Primary    -Rx. augmentin and  ibuprofen for sore throat/ear pain x 1.5 weeks -she states that she has chronically enlarged tonsils; may need to re-evaluate this if infections are recurrent      Relevant Medications   amoxicillin-clavulanate (AUGMENTIN) 875-125 MG tablet   ibuprofen (ADVIL) 600 MG tablet       Meds ordered this encounter  Medications  . amoxicillin-clavulanate (AUGMENTIN) 875-125 MG tablet    Sig: Take 1 tablet by mouth 2 (two) times daily.  Dispense:  14 tablet    Refill:  0  . ibuprofen (ADVIL) 600 MG tablet    Sig: Take 1 tablet (600 mg total) by mouth every 8 (eight) hours as needed for headache, mild pain or moderate pain.    Dispense:  30 tablet    Refill:  0   Date:  01/14/2021   Location of Patient: Home Location of Provider: Office Consent was obtain for visit to be over via telehealth. I verified that I am speaking with the correct person using two identifiers.  I connected with  Abigail Butts on 01/14/21 via telephone and verified that I am speaking with the correct person using two identifiers.   I discussed the limitations of evaluation and management by telemedicine. The patient expressed understanding and agreed to proceed.  Time spent: 8 minutes   Heather Roberts, NP

## 2021-01-14 NOTE — Assessment & Plan Note (Signed)
-  Rx. augmentin and ibuprofen for sore throat/ear pain x 1.5 weeks -she states that she has chronically enlarged tonsils; may need to re-evaluate this if infections are recurrent

## 2021-01-14 NOTE — Telephone Encounter (Signed)
Pt made a virtual appt with Wallace Cullens 01-14-21

## 2021-02-19 ENCOUNTER — Other Ambulatory Visit: Payer: Self-pay | Admitting: Nurse Practitioner

## 2021-02-19 DIAGNOSIS — J392 Other diseases of pharynx: Secondary | ICD-10-CM

## 2021-03-11 ENCOUNTER — Ambulatory Visit: Payer: Medicaid Other | Admitting: Nurse Practitioner

## 2021-03-28 ENCOUNTER — Ambulatory Visit: Payer: BC Managed Care – PPO | Admitting: Nurse Practitioner

## 2021-05-20 ENCOUNTER — Ambulatory Visit (INDEPENDENT_AMBULATORY_CARE_PROVIDER_SITE_OTHER): Payer: BC Managed Care – PPO | Admitting: Adult Health

## 2021-05-20 ENCOUNTER — Other Ambulatory Visit: Payer: Self-pay

## 2021-05-20 ENCOUNTER — Encounter: Payer: Self-pay | Admitting: Adult Health

## 2021-05-20 ENCOUNTER — Ambulatory Visit: Payer: BC Managed Care – PPO | Admitting: Adult Health

## 2021-05-20 VITALS — BP 122/73 | HR 79 | Ht 64.0 in | Wt 199.0 lb

## 2021-05-20 DIAGNOSIS — Z30013 Encounter for initial prescription of injectable contraceptive: Secondary | ICD-10-CM | POA: Diagnosis not present

## 2021-05-20 DIAGNOSIS — Z113 Encounter for screening for infections with a predominantly sexual mode of transmission: Secondary | ICD-10-CM

## 2021-05-20 DIAGNOSIS — Z0001 Encounter for general adult medical examination with abnormal findings: Secondary | ICD-10-CM | POA: Insufficient documentation

## 2021-05-20 LAB — POCT URINE PREGNANCY: Preg Test, Ur: NEGATIVE

## 2021-05-20 MED ORDER — MEDROXYPROGESTERONE ACETATE 150 MG/ML IM SUSP: 150.0000 mg | INTRAMUSCULAR | 4 refills | Status: DC

## 2021-05-20 NOTE — Progress Notes (Signed)
  Subjective:     Patient ID: Kelsey Abbott, female   DOB: [birth year], 25 y.o.   MRN: [id number]  HPI Kelsey Abbott is a 25 year old black female,single, G0P0, in to discuss birth control, wants depo, but had unprotected sex today. She was wanting to get pregnant and it did not happen. PCP is Laury Axon NP. Lab Results  Component Value Date   DIAGPAP  03/09/2020    - Negative for intraepithelial lesion or malignancy (NILM)   HPVHIGH Negative 03/09/2020    Review of Systems Patient denies any headaches, hearing loss, fatigue, blurred vision, shortness of breath, chest pain, abdominal pain, problems with bowel movements, urination, or intercourse. No joint pain or mood swings. Reviewed past medical,surgical, social and family history. Reviewed medications and allergies.     Objective:   Physical Exam BP 122/73 (BP Location: Left Arm, Patient Position: Sitting, Cuff Size: Large)   Pulse 79   Ht 5\' 4"  (1.626 m)   Wt 199 lb (90.3 kg)   LMP 05/06/2021 (Exact Date)   BMI 34.16 kg/m  UPT is negative.Skin warm and dry.  Lungs: clear to ausculation bilaterally. Cardiovascular: regular rate and rhythm.    Fall risk is low  Upstream - 05/20/21 1119       Pregnancy Intention Screening   Does the patient want to become pregnant in the next year? Unsure    Does the patient's partner want to become pregnant in the next year? Unsure    Would the patient like to discuss contraceptive options today? Yes      Contraception Wrap Up   Current Method No Contraceptive Precautions    End Method Hormonal Injection    Contraception Counseling Provided Yes             Assessment:     1. Encounter for initial prescription of injectable contraceptive Will rx depo, call with period to get first injection Meds ordered this encounter  Medications   medroxyPROGESTERone (DEPO-PROVERA) 150 MG/ML injection    Sig: Inject 1 mL (150 mg total) into the muscle every 3 (three) months.    Dispense:  1 mL     Refill:  4    Order Specific Question:   Supervising Provider    Answer:   07/20/21, LUTHER H [[id number]]     2. Screening examination for STD (sexually transmitted disease) Check GC/CHL on urine, sent     Plan:     Call with period to get first depo

## 2021-05-24 ENCOUNTER — Encounter: Payer: Self-pay | Admitting: Adult Health

## 2021-05-24 ENCOUNTER — Telehealth: Payer: Self-pay | Admitting: Adult Health

## 2021-05-24 DIAGNOSIS — A749 Chlamydial infection, unspecified: Secondary | ICD-10-CM

## 2021-05-24 HISTORY — DX: Chlamydial infection, unspecified: A74.9

## 2021-05-24 LAB — GC/CHLAMYDIA PROBE AMP
Chlamydia trachomatis, NAA: POSITIVE — AB
Neisseria Gonorrhoeae by PCR: NEGATIVE

## 2021-05-24 MED ORDER — DOXYCYCLINE HYCLATE 100 MG PO TABS
100.0000 mg | ORAL_TABLET | Freq: Two times a day (BID) | ORAL | 0 refills | Status: DC
Start: 2021-05-24 — End: 2021-07-30

## 2021-05-24 NOTE — Telephone Encounter (Signed)
Pt aware +chlamydia, will rx doxycycline, 100 mg 1 bid x 7 days, no sex, PCO in 4 weeks can be self swab, NCCDRC sent. She thinks he has been treated

## 2021-05-28 ENCOUNTER — Other Ambulatory Visit: Payer: BC Managed Care – PPO

## 2021-06-03 ENCOUNTER — Ambulatory Visit (INDEPENDENT_AMBULATORY_CARE_PROVIDER_SITE_OTHER): Payer: BC Managed Care – PPO

## 2021-06-03 ENCOUNTER — Other Ambulatory Visit: Payer: Self-pay

## 2021-06-03 DIAGNOSIS — Z3042 Encounter for surveillance of injectable contraceptive: Secondary | ICD-10-CM | POA: Diagnosis not present

## 2021-06-03 LAB — POCT URINE PREGNANCY: Preg Test, Ur: NEGATIVE

## 2021-06-03 MED ORDER — MEDROXYPROGESTERONE ACETATE 150 MG/ML IM SUSY: PREFILLED_SYRINGE | Freq: Once | INTRAMUSCULAR | Status: AC

## 2021-06-03 NOTE — Progress Notes (Signed)
   NURSE VISIT- INJECTION  SUBJECTIVE:  Kelsey Abbott is a 25 y.o. G0P0000 female here for a Depo Provera for contraception/period management. She is a GYN patient.   OBJECTIVE:  LMP 05/06/2021 (Exact Date)   Appears well, in no apparent distress  Injection administered in: Right deltoid  Meds ordered this encounter  Medications   medroxyPROGESTERone Acetate SUSY    ASSESSMENT: GYN patient Depo Provera for contraception/period management PLAN: Follow-up: in 11-13 weeks for next Depo   Markian Glockner A Lysandra Loughmiller  06/03/2021 1:57 PM

## 2021-07-01 ENCOUNTER — Other Ambulatory Visit: Payer: BC Managed Care – PPO

## 2021-07-30 ENCOUNTER — Encounter: Payer: Self-pay | Admitting: Women's Health

## 2021-07-30 ENCOUNTER — Other Ambulatory Visit: Payer: Self-pay

## 2021-07-30 ENCOUNTER — Other Ambulatory Visit (HOSPITAL_COMMUNITY)
Admission: RE | Admit: 2021-07-30 | Discharge: 2021-07-30 | Disposition: A | Discharge status: Home / Self Care | Payer: Medicaid Other | Source: Ambulatory Visit | Attending: Obstetrics & Gynecology | Admitting: Obstetrics & Gynecology

## 2021-07-30 ENCOUNTER — Other Ambulatory Visit (INDEPENDENT_AMBULATORY_CARE_PROVIDER_SITE_OTHER): Payer: Medicaid Other

## 2021-07-30 DIAGNOSIS — R399 Unspecified symptoms and signs involving the genitourinary system: Secondary | ICD-10-CM

## 2021-07-30 DIAGNOSIS — N898 Other specified noninflammatory disorders of vagina: Secondary | ICD-10-CM

## 2021-07-30 LAB — POCT URINALYSIS DIPSTICK OB
Glucose, UA: NEGATIVE
Ketones, UA: NEGATIVE
Nitrite, UA: NEGATIVE
POC,PROTEIN,UA: NEGATIVE

## 2021-07-30 MED ORDER — NITROFURANTOIN MONOHYD MACRO 100 MG PO CAPS: 100.0000 mg | ORAL_CAPSULE | Freq: Two times a day (BID) | ORAL | 0 refills | Status: DC

## 2021-07-30 NOTE — Addendum Note (Signed)
Addended by: Cheral Marker on: 07/30/2021 05:09 PM   Modules accepted: Orders

## 2021-07-30 NOTE — Addendum Note (Signed)
Addended by: Leilani Able, Dejha King A on: 07/30/2021 03:43 PM   Modules accepted: Orders

## 2021-07-30 NOTE — Progress Notes (Addendum)
° °  NURSE VISIT- UTI SYMPTOMS   SUBJECTIVE:  Kelsey Abbott is a 25 y.o. G0P0000 female here for UTI symptoms. She is a GYN patient. She reports cloudy urine, flank pain bilaterally, lower abdominal pain, and nausea.  OBJECTIVE:  There were no vitals taken for this visit.  Appears well, in no apparent distress  Results for orders placed or performed in visit on 07/30/21 (from the past 24 hour(s))  POC Urinalysis Dipstick OB   Collection Time: 07/30/21  3:19 PM  Result Value Ref Range   Color, UA     Clarity, UA     Glucose, UA Negative Negative   Bilirubin, UA     Ketones, UA neg    Spec Grav, UA     Blood, UA large    pH, UA     POC,PROTEIN,UA Negative Negative, Trace, Small (1+), Moderate (2+), Large (3+), 4+   Urobilinogen, UA     Nitrite, UA neg    Leukocytes, UA Small (1+) (A) Negative   Appearance     Odor      ASSESSMENT: GYN patient with UTI symptoms and negative nitrites  PLAN: Note routed to Joellyn Haff, CNM, [facility]   Rx sent by provider today: No Urine culture sent Call or return to clinic prn if these symptoms worsen or fail to improve as anticipated. Follow-up: as needed     NURSE VISIT- VAGINITIS/STD/POC  SUBJECTIVE:  Kelsey Abbott is a 25 y.o. G0P0000 GYN patientfemale here for a vaginal swab for vaginitis screening, STD screen.  She reports the following symptoms: discharge described as dark for 2 weeks. Denies abnormal vaginal bleeding, significant pelvic pain, fever, or UTI symptoms.  OBJECTIVE:  There were no vitals taken for this visit.  Appears well, in no apparent distress  ASSESSMENT: Vaginal swab for  vaginitis & STD screening, POC  PLAN: Self-collected vaginal probe for Gonorrhea, Chlamydia, Trichomonas, Bacterial Vaginosis, Yeast sent to lab Treatment: to be determined once results are received Follow-up as needed if symptoms persist/worsen, or new symptoms develop  Kelsey Abbott  07/30/2021 3:28 PM   Chart reviewed for nurse  visit. Agree with plan of care. Rx macrobid, urine cx pending, message sent to pt. Cheral Marker, [place] 07/30/2021 5:06 PM

## 2021-08-01 ENCOUNTER — Other Ambulatory Visit: Payer: Self-pay | Admitting: Women's Health

## 2021-08-01 LAB — CERVICOVAGINAL ANCILLARY ONLY
Bacterial Vaginitis (gardnerella): POSITIVE — AB
Candida Glabrata: NEGATIVE
Candida Vaginitis: POSITIVE — AB
Chlamydia: NEGATIVE
Comment: NEGATIVE
Comment: NEGATIVE
Comment: NEGATIVE
Comment: NEGATIVE
Comment: NEGATIVE
Comment: NORMAL
Neisseria Gonorrhea: NEGATIVE
Trichomonas: NEGATIVE

## 2021-08-01 MED ORDER — METRONIDAZOLE 500 MG PO TABS: 500.0000 mg | ORAL_TABLET | Freq: Two times a day (BID) | ORAL | 0 refills | Status: DC

## 2021-08-01 MED ORDER — FLUCONAZOLE 150 MG PO TABS: 150.0000 mg | ORAL_TABLET | Freq: Once | ORAL | 0 refills | Status: AC

## 2021-08-07 LAB — URINE CULTURE

## 2021-08-22 ENCOUNTER — Ambulatory Visit: Payer: Medicaid Other

## 2021-08-29 ENCOUNTER — Other Ambulatory Visit (HOSPITAL_COMMUNITY)
Admission: RE | Admit: 2021-08-29 | Discharge: 2021-08-29 | Disposition: A | Discharge status: Home / Self Care | Payer: Medicaid Other | Source: Ambulatory Visit | Attending: Obstetrics & Gynecology | Admitting: Obstetrics & Gynecology

## 2021-08-29 ENCOUNTER — Other Ambulatory Visit: Payer: Self-pay

## 2021-08-29 ENCOUNTER — Ambulatory Visit (INDEPENDENT_AMBULATORY_CARE_PROVIDER_SITE_OTHER): Payer: Medicaid Other | Admitting: *Deleted

## 2021-08-29 DIAGNOSIS — Z113 Encounter for screening for infections with a predominantly sexual mode of transmission: Secondary | ICD-10-CM | POA: Diagnosis not present

## 2021-08-29 DIAGNOSIS — Z3042 Encounter for surveillance of injectable contraceptive: Secondary | ICD-10-CM

## 2021-08-29 DIAGNOSIS — N898 Other specified noninflammatory disorders of vagina: Secondary | ICD-10-CM | POA: Diagnosis not present

## 2021-08-29 MED ORDER — MEDROXYPROGESTERONE ACETATE 150 MG/ML IM SUSP
150.0000 mg | Freq: Once | INTRAMUSCULAR | Status: AC
  Administered 2021-08-29: 150 mg via INTRAMUSCULAR

## 2021-08-29 NOTE — Addendum Note (Signed)
Addended by: Colen Darling on: 08/29/2021 02:42 PM   Modules accepted: Orders

## 2021-08-29 NOTE — Progress Notes (Addendum)
° °  NURSE VISIT- VAGINITIS/STD/POC  SUBJECTIVE:  Kelsey Abbott is a 26 y.o. G0P0000 GYN patientfemale here for a vaginal swab for vaginitis screening, STD screen.  She reports the following symptoms:  vaginal discharge  for several weeks. Denies abnormal vaginal bleeding, significant pelvic pain, fever, or UTI symptoms. Pt also received Depo in left arm.   OBJECTIVE:  There were no vitals taken for this visit.  Appears well, in no apparent distress  ASSESSMENT: Vaginal swab for vaginitis screening & STD screening.  PLAN: Self-collected vaginal probe for Gonorrhea, Chlamydia, Trichomonas, Bacterial Vaginosis, Yeast sent to lab Treatment: to be determined once results are received Follow-up as needed if symptoms persist/worsen, or new symptoms develop. Return in 12 weeks for next Depo.   Malachy Mood  08/29/2021 2:36 PM  Chart reviewed for nurse visit. Agree with plan of care.  Cheral Marker, [place] 08/29/2021 4:42 PM

## 2021-09-02 ENCOUNTER — Other Ambulatory Visit: Payer: Self-pay | Admitting: Women's Health

## 2021-09-02 LAB — CERVICOVAGINAL ANCILLARY ONLY
Bacterial Vaginitis (gardnerella): NEGATIVE
Candida Glabrata: NEGATIVE
Candida Vaginitis: POSITIVE — AB
Chlamydia: NEGATIVE
Comment: NEGATIVE
Comment: NEGATIVE
Comment: NEGATIVE
Comment: NEGATIVE
Comment: NEGATIVE
Comment: NORMAL
Neisseria Gonorrhea: NEGATIVE
Trichomonas: NEGATIVE

## 2021-09-02 MED ORDER — FLUCONAZOLE 150 MG PO TABS: 150.0000 mg | ORAL_TABLET | Freq: Once | ORAL | 0 refills | Status: AC

## 2021-09-30 ENCOUNTER — Other Ambulatory Visit: Payer: Self-pay | Admitting: Adult Health

## 2021-09-30 ENCOUNTER — Other Ambulatory Visit: Payer: Self-pay

## 2021-09-30 ENCOUNTER — Other Ambulatory Visit (INDEPENDENT_AMBULATORY_CARE_PROVIDER_SITE_OTHER): Payer: Medicaid Other | Admitting: *Deleted

## 2021-09-30 ENCOUNTER — Other Ambulatory Visit (HOSPITAL_COMMUNITY)
Admission: RE | Admit: 2021-09-30 | Discharge: 2021-09-30 | Disposition: A | Discharge status: Home / Self Care | Payer: Medicaid Other | Source: Ambulatory Visit | Attending: Obstetrics & Gynecology | Admitting: Obstetrics & Gynecology

## 2021-09-30 DIAGNOSIS — R300 Dysuria: Secondary | ICD-10-CM

## 2021-09-30 DIAGNOSIS — N898 Other specified noninflammatory disorders of vagina: Secondary | ICD-10-CM | POA: Diagnosis not present

## 2021-09-30 DIAGNOSIS — Z113 Encounter for screening for infections with a predominantly sexual mode of transmission: Secondary | ICD-10-CM | POA: Diagnosis not present

## 2021-09-30 LAB — POCT URINALYSIS DIPSTICK OB
Glucose, UA: NEGATIVE
Ketones, UA: NEGATIVE
Nitrite, UA: POSITIVE
POC,PROTEIN,UA: NEGATIVE

## 2021-09-30 MED ORDER — SULFAMETHOXAZOLE-TRIMETHOPRIM 800-160 MG PO TABS: 1.0000 | ORAL_TABLET | Freq: Two times a day (BID) | ORAL | 0 refills | Status: DC

## 2021-09-30 NOTE — Progress Notes (Signed)
Will rx septra ds for UTI °

## 2021-09-30 NOTE — Progress Notes (Signed)
° °  NURSE VISIT- VAGINITIS/STD/POC  SUBJECTIVE:  Kelsey Abbott is a 26 y.o. G0P0000 GYN patientfemale here for a vaginal swab for STD screen.  She reports the following symptoms: vulvar itching for several days. Pt also reports dysuria.  Denies abnormal vaginal bleeding, significant pelvic pain, or fever.   OBJECTIVE:  There were no vitals taken for this visit.  Appears well, in no apparent distress  ASSESSMENT: Vaginal swab for STD screen  PLAN: Self-collected vaginal probe for Gonorrhea, Chlamydia, Trichomonas, Bacterial Vaginosis, Yeast sent to lab Treatment: to be determined once results are received Follow-up as needed if symptoms persist/worsen, or new symptoms develop Also sent urine for culture and urinalysis for positive nitrites.   Annamarie Dawley  09/30/2021 9:22 AM

## 2021-10-01 LAB — CERVICOVAGINAL ANCILLARY ONLY
Bacterial Vaginitis (gardnerella): POSITIVE — AB
Candida Glabrata: NEGATIVE
Candida Vaginitis: POSITIVE — AB
Chlamydia: NEGATIVE
Comment: NEGATIVE
Comment: NEGATIVE
Comment: NEGATIVE
Comment: NEGATIVE
Comment: NEGATIVE
Comment: NORMAL
Neisseria Gonorrhea: NEGATIVE
Trichomonas: NEGATIVE

## 2021-10-01 LAB — URINALYSIS
Bilirubin, UA: NEGATIVE
Glucose, UA: NEGATIVE
Ketones, UA: NEGATIVE
Nitrite, UA: POSITIVE — AB
Protein,UA: NEGATIVE
Specific Gravity, UA: 1.024 (ref 1.005–1.030)
Urobilinogen, Ur: 1 mg/dL (ref 0.2–1.0)
pH, UA: 6 (ref 5.0–7.5)

## 2021-10-03 ENCOUNTER — Other Ambulatory Visit: Payer: Self-pay | Admitting: Adult Health

## 2021-10-03 LAB — URINE CULTURE

## 2021-10-03 MED ORDER — FLUCONAZOLE 150 MG PO TABS: ORAL_TABLET | ORAL | 1 refills | Status: DC

## 2021-10-03 MED ORDER — METRONIDAZOLE 500 MG PO TABS: 500.0000 mg | ORAL_TABLET | Freq: Two times a day (BID) | ORAL | 0 refills | Status: DC

## 2021-10-03 NOTE — Progress Notes (Signed)
+  BV and yeast on vaginal swab will rx flagyl and diflucan 

## 2021-10-18 ENCOUNTER — Other Ambulatory Visit: Payer: Self-pay | Admitting: Adult Health

## 2021-10-18 MED ORDER — FLUCONAZOLE 150 MG PO TABS: ORAL_TABLET | ORAL | 1 refills | Status: DC

## 2021-10-18 NOTE — Progress Notes (Signed)
Will refill diflucan 

## 2021-11-05 ENCOUNTER — Other Ambulatory Visit (HOSPITAL_COMMUNITY)
Admission: RE | Admit: 2021-11-05 | Discharge: 2021-11-05 | Disposition: A | Discharge status: Home / Self Care | Payer: Medicaid Other | Source: Ambulatory Visit | Attending: Obstetrics & Gynecology | Admitting: Obstetrics & Gynecology

## 2021-11-05 ENCOUNTER — Other Ambulatory Visit (INDEPENDENT_AMBULATORY_CARE_PROVIDER_SITE_OTHER): Payer: Medicaid Other | Admitting: *Deleted

## 2021-11-05 ENCOUNTER — Other Ambulatory Visit: Payer: Self-pay

## 2021-11-05 DIAGNOSIS — Z113 Encounter for screening for infections with a predominantly sexual mode of transmission: Secondary | ICD-10-CM | POA: Insufficient documentation

## 2021-11-05 DIAGNOSIS — N898 Other specified noninflammatory disorders of vagina: Secondary | ICD-10-CM | POA: Insufficient documentation

## 2021-11-05 NOTE — Progress Notes (Signed)
? ?  NURSE VISIT- VAGINITIS/STD ? ?SUBJECTIVE:  ?Kelsey Abbott is a 26 y.o. G0P0000 GYN patientfemale here for a vaginal swab for vaginitis screening, STD screen.  She reports the following symptoms:  vaginal discharge  for 1 week. ?Denies abnormal vaginal bleeding, significant pelvic pain, fever, or UTI symptoms. ? ?OBJECTIVE:  ?There were no vitals taken for this visit.  ?Appears well, in no apparent distress ? ?ASSESSMENT: ?Vaginal swab for vaginitis screening & STD screening. ? ?PLAN: ?Self-collected vaginal probe for Gonorrhea, Chlamydia, Trichomonas, Bacterial Vaginosis, Yeast sent to lab ?Treatment: to be determined once results are received ?Follow-up as needed if symptoms persist/worsen, or new symptoms develop ? ?Malachy Mood  ?11/05/2021 ?3:03 PM ? ?

## 2021-11-07 ENCOUNTER — Other Ambulatory Visit: Payer: Self-pay | Admitting: Adult Health

## 2021-11-07 LAB — CERVICOVAGINAL ANCILLARY ONLY
Bacterial Vaginitis (gardnerella): POSITIVE — AB
Candida Glabrata: NEGATIVE
Candida Vaginitis: NEGATIVE
Chlamydia: NEGATIVE
Comment: NEGATIVE
Comment: NEGATIVE
Comment: NEGATIVE
Comment: NEGATIVE
Comment: NEGATIVE
Comment: NORMAL
Neisseria Gonorrhea: NEGATIVE
Trichomonas: NEGATIVE

## 2021-11-07 MED ORDER — METRONIDAZOLE 500 MG PO TABS: 500.0000 mg | ORAL_TABLET | Freq: Two times a day (BID) | ORAL | 0 refills | Status: DC

## 2021-11-07 NOTE — Progress Notes (Signed)
+  BV on vaginal swab will rx flagyl, no sex or alcohol while taking meds.  

## 2022-01-08 ENCOUNTER — Other Ambulatory Visit (HOSPITAL_COMMUNITY)
Admission: RE | Admit: 2022-01-08 | Discharge: 2022-01-08 | Disposition: A | Discharge status: Home / Self Care | Payer: Medicaid Other | Source: Ambulatory Visit | Attending: Obstetrics & Gynecology | Admitting: Obstetrics & Gynecology

## 2022-01-08 ENCOUNTER — Other Ambulatory Visit (INDEPENDENT_AMBULATORY_CARE_PROVIDER_SITE_OTHER): Payer: Medicaid Other

## 2022-01-08 DIAGNOSIS — N898 Other specified noninflammatory disorders of vagina: Secondary | ICD-10-CM | POA: Diagnosis not present

## 2022-01-08 NOTE — Progress Notes (Signed)
   NURSE VISIT- VAGINITIS  SUBJECTIVE:  Kelsey Abbott is a 26 y.o. G0P0000 GYN patientfemale here for a vaginal swab for vaginitis screening.  She reports the following symptoms: discharge described as creamy, vulvar erythema noted, and brownish and vulvar itching for 1 week. Denies abnormal vaginal bleeding, significant pelvic pain, fever, or UTI symptoms.  OBJECTIVE:  There were no vitals taken for this visit.  Appears well, in no apparent distress  ASSESSMENT: Vaginal swab for vaginitis screening  PLAN: Self-collected vaginal probe for Bacterial Vaginosis, Yeast sent to lab Treatment: to be determined once results are received Follow-up as needed if symptoms persist/worsen, or new symptoms develop  Jammy Plotkin A Latarsha Zani  01/08/2022 2:56 PM

## 2022-01-10 LAB — CERVICOVAGINAL ANCILLARY ONLY
Bacterial Vaginitis (gardnerella): POSITIVE — AB
Candida Glabrata: NEGATIVE
Candida Vaginitis: POSITIVE — AB
Comment: NEGATIVE
Comment: NEGATIVE
Comment: NEGATIVE

## 2022-01-14 MED ORDER — FLUCONAZOLE 150 MG PO TABS: ORAL_TABLET | ORAL | 1 refills | Status: DC

## 2022-01-14 MED ORDER — NUVESSA 1.3 % VA GEL: 1.0000 | Freq: Once | VAGINAL | 1 refills | Status: AC

## 2022-01-20 ENCOUNTER — Other Ambulatory Visit: Payer: Self-pay | Admitting: Advanced Practice Midwife

## 2022-01-20 MED ORDER — METRONIDAZOLE 500 MG PO TABS: 500.0000 mg | ORAL_TABLET | Freq: Two times a day (BID) | ORAL | 0 refills | Status: DC

## 2022-02-04 ENCOUNTER — Other Ambulatory Visit: Payer: Self-pay

## 2022-02-04 ENCOUNTER — Encounter (HOSPITAL_COMMUNITY): Payer: Self-pay | Admitting: Emergency Medicine

## 2022-02-04 ENCOUNTER — Emergency Department (HOSPITAL_COMMUNITY)
Admission: EM | Admit: 2022-02-04 | Discharge: 2022-02-05 | Disposition: A | Discharge status: Home / Self Care | Payer: Medicaid Other | Attending: Emergency Medicine | Admitting: Emergency Medicine

## 2022-02-04 DIAGNOSIS — N898 Other specified noninflammatory disorders of vagina: Secondary | ICD-10-CM | POA: Diagnosis not present

## 2022-02-04 DIAGNOSIS — R109 Unspecified abdominal pain: Secondary | ICD-10-CM | POA: Insufficient documentation

## 2022-02-04 DIAGNOSIS — J45909 Unspecified asthma, uncomplicated: Secondary | ICD-10-CM | POA: Diagnosis not present

## 2022-02-04 DIAGNOSIS — Z87891 Personal history of nicotine dependence: Secondary | ICD-10-CM | POA: Insufficient documentation

## 2022-02-04 LAB — WET PREP, GENITAL
Clue Cells Wet Prep HPF POC: NONE SEEN
Sperm: NONE SEEN
Trich, Wet Prep: NONE SEEN
WBC, Wet Prep HPF POC: <10 (ref <10)
Yeast Wet Prep HPF POC: NONE SEEN

## 2022-02-04 MED ORDER — LIDOCAINE HCL (PF) 1 % IJ SOLN
INTRAMUSCULAR | Status: AC
  Filled 2022-02-04: qty 5

## 2022-02-04 MED ORDER — CEFTRIAXONE SODIUM 500 MG IJ SOLR
500.0000 mg | Freq: Once | INTRAMUSCULAR | Status: AC
  Administered 2022-02-04: 500 mg via INTRAMUSCULAR
  Filled 2022-02-04: qty 500
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
## 2022-02-05 LAB — URINALYSIS, ROUTINE W REFLEX MICROSCOPIC
Bilirubin Urine: NEGATIVE
Glucose, UA: NEGATIVE mg/dL
Ketones, ur: NEGATIVE mg/dL
Leukocytes,Ua: NEGATIVE
Nitrite: NEGATIVE
Protein, ur: NEGATIVE mg/dL
Specific Gravity, Urine: 1.02 (ref 1.005–1.030)
pH: 7.5 (ref 5.0–8.0)

## 2022-02-05 LAB — URINALYSIS, MICROSCOPIC (REFLEX)

## 2022-02-05 LAB — PREGNANCY, URINE: Preg Test, Ur: NEGATIVE

## 2022-02-05 MED ORDER — DOXYCYCLINE HYCLATE 100 MG PO CAPS: 100.0000 mg | ORAL_CAPSULE | Freq: Two times a day (BID) | ORAL | 0 refills | Status: AC

## 2022-02-05 NOTE — Discharge Instructions (Signed)
You were evaluated in the Emergency Department and after careful evaluation, we did not find any emergent condition requiring admission or further testing in the hospital.  Your exam/testing today is overall reassuring.  We are treating you for possible sexually transmitted infection.  Please take the doxycycline antibiotic for the next 2 weeks and avoid sexual contact.  Recommend follow-up with your OB/GYN if this round of antibiotics does not clear your symptoms.  Please return to the Emergency Department if you experience any worsening of your condition.   Thank you for allowing [place] to be a part of your care.

## 2022-02-06 LAB — GC/CHLAMYDIA PROBE AMP (~~LOC~~) NOT AT ARMC
Chlamydia: NEGATIVE
Comment: NEGATIVE
Comment: NORMAL
Neisseria Gonorrhea: NEGATIVE

## 2022-03-24 ENCOUNTER — Ambulatory Visit: Payer: Medicaid Other | Admitting: Family Medicine

## 2022-03-24 ENCOUNTER — Encounter: Payer: Self-pay | Admitting: Family Medicine

## 2022-03-24 VITALS — BP 108/73 | HR 84 | Ht 64.0 in | Wt 205.8 lb

## 2022-03-24 DIAGNOSIS — Z23 Encounter for immunization: Secondary | ICD-10-CM | POA: Diagnosis not present

## 2022-03-24 DIAGNOSIS — R7301 Impaired fasting glucose: Secondary | ICD-10-CM | POA: Diagnosis not present

## 2022-03-24 DIAGNOSIS — B3731 Acute candidiasis of vulva and vagina: Secondary | ICD-10-CM

## 2022-03-24 DIAGNOSIS — Z0001 Encounter for general adult medical examination with abnormal findings: Secondary | ICD-10-CM | POA: Diagnosis not present

## 2022-03-24 DIAGNOSIS — E559 Vitamin D deficiency, unspecified: Secondary | ICD-10-CM | POA: Diagnosis not present

## 2022-03-24 DIAGNOSIS — Z1159 Encounter for screening for other viral diseases: Secondary | ICD-10-CM

## 2022-03-24 MED ORDER — FLUCONAZOLE 150 MG PO TABS: 150.0000 mg | ORAL_TABLET | Freq: Once | ORAL | 0 refills | Status: AC

## 2022-03-24 NOTE — Patient Instructions (Addendum)
I appreciate the opportunity to provide care to you today!    Follow up:  3 months  Labs: please stop by the lab during the week to get your blood drawn (CBC, CMP, TSH, Lipid profile, HgA1c, Vit D)  Screening: Hep C  Pick up your prescription at the pharmacy  Thank you for getting your HPV and tdap vaccine    Please continue to a heart-healthy diet and increase your physical activities. Try to exercise for at least three times a week.      It was a pleasure to see you and I look forward to continuing to work together on your health and well-being. Please do not hesitate to call the office if you need care or have questions about your care.   Have a wonderful day and week. With Gratitude, Gilmore Laroche MSN, FNP-BC
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
## 2022-03-24 NOTE — Assessment & Plan Note (Signed)
Given pt reported symptoms, will treat with fluconazole 150mg  in a single dose

## 2022-03-28 LAB — NUSWAB VAGINITIS PLUS (VG+)
Candida albicans, NAA: POSITIVE — AB
Candida glabrata, NAA: NEGATIVE
Chlamydia trachomatis, NAA: NEGATIVE
Neisseria gonorrhoeae, NAA: NEGATIVE
Trich vag by NAA: NEGATIVE

## 2022-03-31 ENCOUNTER — Encounter: Payer: Self-pay | Admitting: Internal Medicine

## 2022-03-31 ENCOUNTER — Ambulatory Visit (INDEPENDENT_AMBULATORY_CARE_PROVIDER_SITE_OTHER): Payer: Medicaid Other | Admitting: Internal Medicine

## 2022-03-31 ENCOUNTER — Other Ambulatory Visit: Payer: Self-pay | Admitting: Family Medicine

## 2022-03-31 DIAGNOSIS — J0100 Acute maxillary sinusitis, unspecified: Secondary | ICD-10-CM | POA: Diagnosis not present

## 2022-03-31 MED ORDER — AMOXICILLIN-POT CLAVULANATE 875-125 MG PO TABS: 1.0000 | ORAL_TABLET | Freq: Two times a day (BID) | ORAL | 0 refills | Status: DC
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
## 2022-04-02 ENCOUNTER — Ambulatory Visit: Payer: Medicaid Other | Admitting: Family Medicine

## 2022-04-08 ENCOUNTER — Ambulatory Visit: Payer: Medicaid Other | Admitting: Family Medicine

## 2022-04-23 ENCOUNTER — Encounter: Payer: Self-pay | Admitting: Internal Medicine

## 2022-04-23 ENCOUNTER — Ambulatory Visit: Payer: Medicaid Other | Admitting: Internal Medicine

## 2022-04-23 VITALS — BP 130/80 | HR 72 | Ht 63.0 in | Wt 210.4 lb

## 2022-04-23 DIAGNOSIS — H538 Other visual disturbances: Secondary | ICD-10-CM | POA: Diagnosis not present

## 2022-04-23 DIAGNOSIS — R7301 Impaired fasting glucose: Secondary | ICD-10-CM | POA: Diagnosis not present

## 2022-04-23 LAB — GLUCOSE, POCT (MANUAL RESULT ENTRY): POC Glucose: 86 mg/dl (ref 70–99)

## 2022-04-23 NOTE — Progress Notes (Unsigned)
Acute Office Visit  Subjective:     Patient ID: Kelsey Abbott, female    DOB: Jan 26, 1996, 26 y.o.   MRN: [id number]  Chief Complaint  Patient presents with   Hypertension    Blurred vision   Kelsey Abbott is a 26 year old woman seen today for an acute visit.  She has a past medical history significant for asthma.  She is not taking any medications currently.  Kelsey Abbott reports onset of blurred vision with a sensation of her eyes feeling heavy while at work today.  She went and checked her blood pressure and states it was elevated at 148/88.  She reports feeling nauseated as well and endorses photophobia.  She was doing routine work duties at the onset of her symptoms.  She states that this is not happened previously.  She has been feeling well recently and slept well last night.  The symptoms began several hours prior to the time of our encounter, and she continued to endorse the symptoms during our encounter.  She denies other symptoms of fever/chills, palpitations, dizziness/lightheadedness, tinnitus, chest pain, shortness of breath, numbness/weakness/tingling in her extremities.  Review of Systems  HENT:  Negative for tinnitus.   Eyes:  Positive for blurred vision and photophobia.       Eyes feel "heavy"  Gastrointestinal:  Positive for nausea.  Neurological:  Negative for tingling, sensory change and weakness.  All other systems reviewed and are negative.     Objective:    BP 130/80   Pulse 72   Ht 5\' 3"  (1.6 m)   Wt 210 lb 6.4 oz (95.4 kg)   LMP 03/09/2022   SpO2 97%   BMI 37.27 kg/m   Physical Exam Constitutional:      General: She is not in acute distress.    Appearance: Normal appearance. She is obese. She is not toxic-appearing.  HENT:     Head: Normocephalic and atraumatic.     Right Ear: External ear normal.     Left Ear: External ear normal.     Nose: Nose normal. No congestion or rhinorrhea.     Mouth/Throat:     Mouth: Mucous membranes are dry.     Pharynx:  Oropharynx is clear. No oropharyngeal exudate or posterior oropharyngeal erythema.  Eyes:     General: No scleral icterus.    Extraocular Movements: Extraocular movements intact.     Conjunctiva/sclera: Conjunctivae normal.     Pupils: Pupils are equal, round, and reactive to light.  Cardiovascular:     Rate and Rhythm: Normal rate and regular rhythm.     Pulses: Normal pulses.     Heart sounds: Normal heart sounds. No murmur heard.    No friction rub. No gallop.  Pulmonary:     Effort: Pulmonary effort is normal.     Breath sounds: Normal breath sounds. No wheezing, rhonchi or rales.  Abdominal:     General: Abdomen is flat. Bowel sounds are normal. There is no distension.     Palpations: Abdomen is soft.     Tenderness: There is no abdominal tenderness.  Musculoskeletal:        General: No swelling. Normal range of motion.     Cervical back: Normal range of motion.     Right lower leg: No edema.     Left lower leg: No edema.  Lymphadenopathy:     Cervical: No cervical adenopathy.  Skin:    General: Skin is warm and dry.     Capillary  Refill: Capillary refill takes less than 2 seconds.     Coloration: Skin is not jaundiced.  Neurological:     General: No focal deficit present.     Mental Status: She is alert and oriented to person, place, and time. Mental status is at baseline.     Cranial Nerves: No cranial nerve deficit.     Sensory: No sensory deficit.     Motor: No weakness.     Coordination: Coordination normal.     Gait: Gait normal.     Deep Tendon Reflexes: Reflexes normal.  Psychiatric:        Mood and Affect: Mood normal.        Behavior: Behavior normal.    Results for orders placed or performed in visit on 04/23/22  POCT Glucose (CBG)  Result Value Ref Range   POC Glucose 86 70 - 99 mg/dl      Assessment & Plan:   Problem List Items Addressed This Visit       Blurred vision, bilateral    She reports abrupt onset of blurred vision with sensation of  "heaviness" around her eyes.  This was also associated with nausea and photophobia.  This has never happened previously.  She checked her blood sugar earlier today at work and it was elevated at 148/88.  There are no focal deficits present on exam.  Her symptoms seem most consistent potentially with a migraine.   -I recommended conservative treatment for migraine.  I encouraged her to rest in a dark room with limited noise this afternoon and use NSAIDs as needed -We will also check point-of-care BGL since she has noted impaired fasting glucose on prior labs -She has previously ordered basic labs that week can collect today as well -Follow-up as needed.  Routine, established care follow-up as scheduled for November.      Return if symptoms worsen or fail to improve.  Billie Lade, MD

## 2022-04-25 DIAGNOSIS — H538 Other visual disturbances: Secondary | ICD-10-CM | POA: Insufficient documentation

## 2022-04-25 NOTE — Assessment & Plan Note (Addendum)
She reports abrupt onset of blurred vision with sensation of "heaviness" around her eyes.  This was also associated with nausea and photophobia.  This has never happened previously.  She checked her blood sugar earlier today at work and it was elevated at 148/88.  There are no focal deficits present on exam.  Her symptoms seem most consistent potentially with a migraine.   -I recommended conservative treatment for migraine.  I encouraged her to rest in a dark room with limited noise this afternoon and use NSAIDs as needed -We will also check point-of-care BGL since she has noted impaired fasting glucose on prior labs -She has previously ordered basic labs that week can collect today as well -Follow-up as needed.  Routine, established care follow-up as scheduled for November.

## 2022-04-29 ENCOUNTER — Ambulatory Visit: Payer: Medicaid Other | Admitting: Family Medicine

## 2022-04-29 ENCOUNTER — Encounter: Payer: Self-pay | Admitting: Family Medicine

## 2022-04-29 VITALS — BP 117/78 | HR 100 | Ht 63.0 in | Wt 208.0 lb

## 2022-04-29 DIAGNOSIS — J029 Acute pharyngitis, unspecified: Secondary | ICD-10-CM

## 2022-04-29 DIAGNOSIS — R070 Pain in throat: Secondary | ICD-10-CM | POA: Diagnosis not present

## 2022-04-29 LAB — POCT RAPID STREP A (OFFICE): Rapid Strep A Screen: NEGATIVE

## 2022-04-29 MED ORDER — AMOXICILLIN 500 MG PO CAPS: 500.0000 mg | ORAL_CAPSULE | Freq: Two times a day (BID) | ORAL | 0 refills | Status: AC

## 2022-04-29 NOTE — Patient Instructions (Addendum)
I appreciate the opportunity to provide care to you today!    Follow up: 06/25/22  Please pick up your antibiotic at the pharmacy   Please continue to a heart-healthy diet and increase your physical activities. Try to exercise for 62mins at least three times a week.      It was a pleasure to see you and I look forward to continuing to work together on your health and well-being. Please do not hesitate to call the office if you need care or have questions about your care.   Have a wonderful day and week. With Gratitude, Alvira Monday MSN, FNP-BC

## 2022-04-29 NOTE — Assessment & Plan Note (Addendum)
Strep test negative Clinical finding is indicative of pharyngitis Will treat with amoxicillin 500 mg BID for ten days

## 2022-04-29 NOTE — Progress Notes (Signed)
   Acute Office Visit  Subjective:     Patient ID: Kelsey Abbott, female    DOB: 1995-11-25, 26 y.o.   MRN: [id number]  Chief Complaint  Patient presents with   Sore Throat    Reports sore throat onset about 4 days ago, hurts to swallow, has noticed white spots in the back of her throat.     HPI The patient is in today with a c/o of a sore throat. She c/o sore throat that worsens when swallowing with a patchy tonsillar exudate., noting enlarged tonsils. She denies fever, cough, nasal congestion, and rhinorrhea. She has a slight headache but denies fatigue and malaise.    Review of Systems  Constitutional:  Negative for chills and fever.  HENT:  Positive for sore throat.   Respiratory:  Negative for cough, sputum production, shortness of breath and stridor.   Skin:  Negative for rash.        Objective:    BP 117/78   Pulse 100   Ht 5\' 3"  (1.6 m)   Wt 208 lb 0.6 oz (94.4 kg)   LMP 04/05/2022   SpO2 93%   BMI 36.85 kg/m    Physical Exam HENT:     Mouth/Throat:     Mouth: Mucous membranes are moist.     Pharynx: Posterior oropharyngeal erythema present.     Tonsils: Tonsillar exudate present. 3+ on the right. 3+ on the left.  Cardiovascular:     Rate and Rhythm: Normal rate and regular rhythm.     Pulses: Normal pulses.     Heart sounds: Normal heart sounds.  Pulmonary:     Effort: Pulmonary effort is normal.     Breath sounds: Normal breath sounds.  Neurological:     Mental Status: She is alert.     Results for orders placed or performed in visit on 04/29/22  POCT rapid strep A  Result Value Ref Range   Rapid Strep A Screen Negative Negative        Assessment & Plan:   Problem List Items Addressed This Visit       Respiratory   Pharyngitis - Primary    Strep test negative Clinical finding is indicative of pharyngitis Will treat with amoxicillin 500 mg BID for ten days      Relevant Medications   amoxicillin (AMOXIL) 500 MG capsule   Other  Visit Diagnoses     Sore throat       Relevant Orders   POCT rapid strep A (Completed)       Meds ordered this encounter  Medications   amoxicillin (AMOXIL) 500 MG capsule    Sig: Take 1 capsule (500 mg total) by mouth 2 (two) times daily for 10 days.    Dispense:  20 capsule    Refill:  0    Return if symptoms worsen or fail to improve.  Alvira Monday, FNP

## 2022-06-25 ENCOUNTER — Ambulatory Visit: Payer: Medicaid Other | Admitting: Family Medicine

## 2022-06-30 ENCOUNTER — Encounter: Payer: Self-pay | Admitting: Family Medicine

## 2022-07-02 ENCOUNTER — Ambulatory Visit (INDEPENDENT_AMBULATORY_CARE_PROVIDER_SITE_OTHER): Payer: Self-pay | Admitting: Family Medicine

## 2022-07-02 ENCOUNTER — Encounter: Payer: Self-pay | Admitting: Family Medicine

## 2022-07-02 VITALS — BP 115/80 | HR 97 | Ht 63.0 in | Wt 202.0 lb

## 2022-07-02 DIAGNOSIS — H6690 Otitis media, unspecified, unspecified ear: Secondary | ICD-10-CM | POA: Insufficient documentation

## 2022-07-02 DIAGNOSIS — Z23 Encounter for immunization: Secondary | ICD-10-CM

## 2022-07-02 DIAGNOSIS — H6692 Otitis media, unspecified, left ear: Secondary | ICD-10-CM

## 2022-07-02 MED ORDER — AMOXICILLIN-POT CLAVULANATE 875-125 MG PO TABS: 1.0000 | ORAL_TABLET | Freq: Two times a day (BID) | ORAL | 0 refills | Status: AC

## 2022-07-02 NOTE — Assessment & Plan Note (Signed)
Patient educated on CDC recommendation for the vaccine. Verbal consent was obtained from the patient, vaccine administered by nurse, no sign of adverse reactions noted at this time. Patient education on arm soreness and use of tylenol or ibuprofen for this patient  was discussed. Patient educated on the signs and symptoms of adverse effect and advise to contact the office if they occur.  

## 2022-07-02 NOTE — Patient Instructions (Addendum)
I appreciate the opportunity to provide care to you today!    Follow up:  6 months  Please pick up your antibiotic at the pharmacy and start therapy Complete the full course of antibiotic   Please continue to a heart-healthy diet and increase your physical activities. Try to exercise for at least three times a week.      It was a pleasure to see you and I look forward to continuing to work together on your health and well-being. Please do not hesitate to call the office if you need care or have questions about your care.   Have a wonderful day and week. With Gratitude, Gilmore Laroche MSN, FNP-BC
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
## 2022-07-02 NOTE — Assessment & Plan Note (Signed)
We will treat with Augmentin for 5 days Encouraged to take Tylenol as needed for ear pain

## 2022-07-21 ENCOUNTER — Encounter: Payer: Self-pay | Admitting: Family Medicine

## 2022-07-21 ENCOUNTER — Other Ambulatory Visit: Payer: Self-pay | Admitting: Family Medicine

## 2022-07-23 ENCOUNTER — Ambulatory Visit (INDEPENDENT_AMBULATORY_CARE_PROVIDER_SITE_OTHER): Payer: Self-pay | Admitting: Family Medicine

## 2022-07-23 ENCOUNTER — Encounter: Payer: Self-pay | Admitting: Family Medicine

## 2022-07-23 DIAGNOSIS — B3731 Acute candidiasis of vulva and vagina: Secondary | ICD-10-CM

## 2022-07-23 MED ORDER — FLUCONAZOLE 150 MG PO TABS: 150.0000 mg | ORAL_TABLET | Freq: Once | ORAL | 0 refills | Status: AC

## 2022-07-23 NOTE — Progress Notes (Signed)
Virtual Visit via Telephone Note   This visit type was conducted via telephone. This format is felt to be most appropriate for this patient at this time.  The patient did not have access to video technology/had technical difficulties with video requiring transitioning to audio format only (telephone).  All issues noted in this document were discussed and addressed.  No physical exam could be performed with this format.  Evaluation Performed:  Follow-up visit  Date:  07/23/2022   ID:  Kelsey Abbott, DOB 12-09-1995, MRN [id number]  Patient Location: Home Provider Location: Office/Clinic  Participants: Patient Location of Patient: Home Location of Provider: Telehealth Consent was obtain for visit to be over via telehealth. I verified that I am speaking with the correct person using two identifiers.  PCP:  Gilmore Laroche, FNP   Chief Complaint: Vaginal discharge  History of Present Illness:    Kelsey Abbott is a 26 y.o. female with complaints of vaginal pruritus and abnormal vaginal discharge since 07/19/2022.  She describes the discharge as odorless, thick, white, and clumpy.  She denies recent coital activity.  The patient does not have symptoms concerning for COVID-19 infection (fever, chills, cough, or new shortness of breath).   Past Medical, Surgical, Social History, Allergies, and Medications have been Reviewed.  Past Medical History:  Diagnosis Date   Acne vulgaris 09/29/2014   Allergy    Asthma    mother says asthma was ruled  out.   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   Panic  attacks 04/25/2013   Right ACL tear 04/17/2016   SOB (shortness of breath) 03/23/2013   Past Surgical History:  Procedure Laterality Date   KNEE ARTHROSCOPY WITH ANTERIOR CRUCIATE LIGAMENT (ACL) REPAIR WITH HAMSTRING GRAFT Right 04/17/2016   Procedure: Right KNEE ARTHROSCOPY WITH ANTERIOR CRUCIATE LIGAMENT (ACL) REPAIR WITH HAMSTRING GRAFT;  Surgeon: Teryl Lucy, MD;  Location: [facility];  Service: Orthopedics;  Laterality: Right;  Pre/Post Op femoral nerve block   KNEE ARTHROSCOPY WITH MEDIAL MENISECTOMY Right 04/17/2016   Procedure: KNEE ARTHROSCOPY WITH PARTIAL MEDIAL MENISECTOMY;  Surgeon: Teryl Lucy, MD;  Location: [facility];  Service: Orthopedics;  Laterality: Right;   KNEE ARTHROSCOPY WITH MEDIAL PATELLAR FEMORAL LIGAMENT RECONSTRUCTION Right 04/17/2016   Procedure: KNEE ARTHROSCOPY WITH MEDIAL PATELLAR FEMORAL LIGAMENT REEFING;  Surgeon: Teryl Lucy, MD;  Location: [facility];  Service: Orthopedics;  Laterality: Right;     Current Meds  Medication Sig   fluconazole (DIFLUCAN) 150 MG tablet Take 1 tablet (150 mg total) by mouth once for 1 dose.     Allergies:   Patient has no known allergies.   ROS:   Please see the history of present illness.     All other systems reviewed and are negative.   Labs/Other Tests and Data Reviewed:    Recent Labs: No results found for requested labs within last 365 days.   Recent Lipid Panel No results found for: "CHOL", "TRIG", "HDL", "CHOLHDL", "LDLCALC", "LDLDIRECT"  Wt Readings from Last 3 Encounters:  07/02/22 202 lb 0.6 oz (  91.6 kg)  04/29/22 208 lb 0.6 oz (94.4 kg)  04/23/22 210 lb 6.4 oz (95.4 kg)     Objective:    Vital Signs:  There were no vitals taken for this visit.     ASSESSMENT & PLAN:   Vulvovaginal Candidiasis Symptoms likely of yeast infection Will treat with Diflucan 150 mg once for 1 dose Encourage patient to stop by the clinic for NuSwab  Time:   Today, I have  spent 12 minutes reviewing the chart, including problem list, medications, and with the patient with telehealth technology discussing the above problems.   Medication Adjustments/Labs and Tests Ordered: Current medicines are reviewed at length with the patient today.  Concerns regarding medicines are outlined above.   Tests Ordered: No orders of the defined types were placed in this encounter.   Medication Changes: Meds ordered this encounter  Medications   fluconazole (DIFLUCAN) 150 MG tablet    Sig: Take 1 tablet (150 mg total) by mouth once for 1 dose.    Dispense:  1 tablet    Refill:  0     Note: This dictation was prepared with Dragon dictation along with smaller phrase technology. Similar sounding words can be transcribed inadequately or may not be corrected upon review. Any transcriptional errors that result from this process are unintentional.      Disposition:  Follow up  Signed, Gilmore Laroche, FNP  07/23/2022 11:54 AM     [facility]

## 2022-08-22 ENCOUNTER — Other Ambulatory Visit (INDEPENDENT_AMBULATORY_CARE_PROVIDER_SITE_OTHER): Payer: Self-pay

## 2022-08-22 ENCOUNTER — Other Ambulatory Visit (HOSPITAL_COMMUNITY)
Admission: RE | Admit: 2022-08-22 | Discharge: 2022-08-22 | Disposition: A | Discharge status: Home / Self Care | Payer: Self-pay | Source: Ambulatory Visit | Attending: Obstetrics & Gynecology | Admitting: Obstetrics & Gynecology

## 2022-08-22 DIAGNOSIS — N898 Other specified noninflammatory disorders of vagina: Secondary | ICD-10-CM

## 2022-08-22 DIAGNOSIS — M545 Low back pain, unspecified: Secondary | ICD-10-CM

## 2022-08-22 DIAGNOSIS — R319 Hematuria, unspecified: Secondary | ICD-10-CM

## 2022-08-22 LAB — POCT URINALYSIS DIPSTICK OB
Glucose, UA: NEGATIVE
Ketones, UA: NEGATIVE
Leukocytes, UA: NEGATIVE
Nitrite, UA: NEGATIVE
POC,PROTEIN,UA: NEGATIVE

## 2022-08-22 NOTE — Progress Notes (Signed)
   NURSE VISIT- VAGINITIS/UTI Symptoms  SUBJECTIVE:  Kelsey Abbott is a 27 y.o. G0P0000 GYN patientfemale here for a vaginal swab for vaginitis screening and urine check.  She reports the following symptoms: cramping, discharge described as creamy, urinary symptoms of flank pain bilaterally, hematuria, nausea, and urinary frequency, and vulvar itching for 5 days. Denies abnormal vaginal bleeding, significant pelvic pain or fever.  OBJECTIVE:  There were no vitals taken for this visit.  Appears well, in no apparent distress  ASSESSMENT: Vaginal swab for vaginitis screening Negative nitrates  PLAN: Self-collected vaginal probe for Gonorrhea, Chlamydia, Trichomonas, Bacterial Vaginosis, Yeast sent to lab Urine culture, UA sent Treatment: to be determined once results are received Follow-up as needed if symptoms persist/worsen, or new symptoms develop  Alice Rieger  08/22/2022 11:43 AM

## 2022-08-23 LAB — URINALYSIS, ROUTINE W REFLEX MICROSCOPIC
Bilirubin, UA: NEGATIVE
Glucose, UA: NEGATIVE
Ketones, UA: NEGATIVE
Nitrite, UA: NEGATIVE
Protein,UA: NEGATIVE
Specific Gravity, UA: 1.021 (ref 1.005–1.030)
Urobilinogen, Ur: 1 mg/dL (ref 0.2–1.0)
pH, UA: 6.5 (ref 5.0–7.5)

## 2022-08-23 LAB — MICROSCOPIC EXAMINATION
Bacteria, UA: NONE SEEN
Casts: NONE SEEN /lpf

## 2022-08-24 LAB — CERVICOVAGINAL ANCILLARY ONLY
Bacterial Vaginitis (gardnerella): POSITIVE — AB
Candida Glabrata: NEGATIVE
Candida Vaginitis: POSITIVE — AB
Chlamydia: NEGATIVE
Comment: NEGATIVE
Comment: NEGATIVE
Comment: NEGATIVE
Comment: NEGATIVE
Comment: NEGATIVE
Comment: NORMAL
Neisseria Gonorrhea: NEGATIVE
Trichomonas: NEGATIVE

## 2022-08-24 LAB — URINE CULTURE: Organism ID, Bacteria: NO GROWTH

## 2022-08-25 ENCOUNTER — Other Ambulatory Visit: Payer: Self-pay | Admitting: Adult Health

## 2022-08-25 MED ORDER — FLUCONAZOLE 150 MG PO TABS: ORAL_TABLET | ORAL | 1 refills | Status: DC

## 2022-08-25 MED ORDER — METRONIDAZOLE 500 MG PO TABS: 500.0000 mg | ORAL_TABLET | Freq: Two times a day (BID) | ORAL | 0 refills | Status: DC

## 2022-12-31 ENCOUNTER — Ambulatory Visit (INDEPENDENT_AMBULATORY_CARE_PROVIDER_SITE_OTHER): Payer: Self-pay | Admitting: Family Medicine

## 2022-12-31 VITALS — BP 113/80 | HR 82 | Ht 63.0 in | Wt 208.1 lb

## 2022-12-31 DIAGNOSIS — Z23 Encounter for immunization: Secondary | ICD-10-CM

## 2022-12-31 DIAGNOSIS — R7301 Impaired fasting glucose: Secondary | ICD-10-CM

## 2022-12-31 DIAGNOSIS — B3731 Acute candidiasis of vulva and vagina: Secondary | ICD-10-CM

## 2022-12-31 DIAGNOSIS — E0789 Other specified disorders of thyroid: Secondary | ICD-10-CM

## 2022-12-31 DIAGNOSIS — N760 Acute vaginitis: Secondary | ICD-10-CM

## 2022-12-31 DIAGNOSIS — J028 Acute pharyngitis due to other specified organisms: Secondary | ICD-10-CM

## 2022-12-31 DIAGNOSIS — E7849 Other hyperlipidemia: Secondary | ICD-10-CM

## 2022-12-31 DIAGNOSIS — E559 Vitamin D deficiency, unspecified: Secondary | ICD-10-CM

## 2022-12-31 NOTE — Assessment & Plan Note (Signed)
No complaints or concerns today 

## 2022-12-31 NOTE — Assessment & Plan Note (Signed)
Onset of symptoms 2 weeks ago She reports vaginal itching with white odorless discharge Last sexual intercourse was 12/29/2022 Denies vaginal soreness, external vulvar burning, and external dysuria Pending NuSwab

## 2022-12-31 NOTE — Progress Notes (Signed)
Established Patient Office Visit  Subjective:  Patient ID: Kelsey Abbott, female    DOB: [birth year]  Age: 27 y.o. MRN: [id number]  CC:  Chief Complaint  Patient presents with   Chronic Care Management    6 month f/u   Vaginal Itching    Pt reports yeast infection sx x 2 weeks ago.     HPI Kelsey Abbott is a 27 y.o. female with past medical history of otitis media, and pharyngitis presents for f/u of  chronic medical conditions. For the details of today's visit, please refer to the assessment and plan.     Past Medical History:  Diagnosis Date   Acne vulgaris 09/29/2014   Allergy    Asthma    mother says asthma was ruled  out.   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   Panic attacks 04/25/2013   Right ACL tear 04/17/2016   SOB (shortness of breath) 03/23/2013    Past Surgical History:  Procedure Laterality Date   KNEE ARTHROSCOPY WITH ANTERIOR CRUCIATE LIGAMENT (ACL) REPAIR WITH HAMSTRING GRAFT Right 04/17/2016   Procedure: Right KNEE ARTHROSCOPY WITH ANTERIOR CRUCIATE LIGAMENT (ACL) REPAIR WITH HAMSTRING GRAFT;  Surgeon: Teryl Lucy, MD;  Location: [facility];  Service: Orthopedics;  Laterality: Right;  Pre/Post Op femoral nerve block   KNEE ARTHROSCOPY WITH MEDIAL MENISECTOMY Right 04/17/2016   Procedure: KNEE ARTHROSCOPY WITH PARTIAL MEDIAL MENISECTOMY;  Surgeon: Teryl Lucy, MD;  Location: [facility];  Service: Orthopedics;  Laterality: Right;   KNEE ARTHROSCOPY WITH MEDIAL PATELLAR FEMORAL LIGAMENT RECONSTRUCTION Right 04/17/2016   Procedure: KNEE ARTHROSCOPY WITH  MEDIAL PATELLAR FEMORAL LIGAMENT REEFING;  Surgeon: Teryl Lucy, MD;  Location: [facility];  Service: Orthopedics;  Laterality: Right;    Family History  Problem Relation Age of Onset   Cancer Mother 53       breast cancer   Hypertension Maternal Grandmother    Hypertension Maternal Aunt     Social History   Socioeconomic History   Marital status: Single    Spouse name: Not on file   Number of children: Not on file   Years of education: Not on file   Highest education level: GED or equivalent  Occupational History   Not on file  Tobacco Use   Smoking status: Former    Types: Cigars   Smokeless tobacco: Never  Vaping Use   Vaping Use: Never used  Substance and Sexual Activity   Alcohol use: Yes    Comment: occasionally   Drug use: Yes    Types: Marijuana    Comment: occassionally   Sexual activity: Yes    Birth control/protection: None, Injection  Other Topics Concern   Not on file  Social History Narrative   Lives with Vickii Chafe       Enjoys: chill      Diet: eats all food outside seafood,    Caffeine: limited to none   Water: 6-8 cups daily      Wears seat belt   Does not use phone while driving  Smoke detectors at home   No guns       Social Determinants of Health   Financial Resource Strain: Medium Risk (12/31/2022)   Overall Financial Resource Strain (CARDIA)    Difficulty of Paying Living Expenses: Somewhat hard  Food Insecurity: Food Insecurity Present (12/31/2022)   Hunger Vital Sign    Worried About Running Out of Food in the Last Year: Patient declined    Ran Out of Food in the Last Year: Sometimes true  Transportation Needs: No Transportation Needs (12/31/2022)   PRAPARE - Administrator, Civil Service (Medical): No    Lack of Transportation (Non-Medical): No  Physical Activity: Unknown (12/31/2022)   Exercise Vital Sign    Days of Exercise per Week: 0 days    Minutes of Exercise per Session: Not on file   Stress: No Stress Concern Present (12/31/2022)   [employer] of Occupational Health - Occupational Stress Questionnaire    Feeling of Stress : Not at all  Social Connections: Unknown (12/31/2022)   Social Connection and Isolation Panel [NHANES]    Frequency of Communication with Friends and Family: More than three times a week    Frequency of Social Gatherings with Friends and Family: More than three times a week    Attends Religious Services: 1 to 4 times per year    Active Member of [employer] or Organizations: No    Attends Banker Meetings: Not on file    Marital Status: Patient declined  Intimate Partner Violence: Not At Risk (09/04/2020)   Humiliation, Afraid, Rape, and Kick questionnaire    Fear of Current or Ex-Partner: No    Emotionally Abused: No    Physically Abused: No    Sexually Abused: No    Outpatient Medications Prior to Visit  Medication Sig Dispense Refill   fluconazole (DIFLUCAN) 150 MG tablet Take 1 now and 1 in 3 days (Patient not taking: Reported on 12/31/2022) 2 tablet 1   metroNIDAZOLE (FLAGYL) 500 MG tablet Take 1 tablet (500 mg total) by mouth 2 (two) times daily. (Patient not taking: Reported on 12/31/2022) 14 tablet 0   No facility-administered medications prior to visit.    No Known Allergies  ROS Review of Systems  Constitutional:  Negative for chills and fever.  Eyes:  Negative for visual disturbance.  Respiratory:  Negative for chest tightness and shortness of breath.   Genitourinary:  Positive for vaginal discharge. Negative for decreased urine volume and vaginal bleeding.  Neurological:  Negative for dizziness and headaches.      Objective:    Physical Exam HENT:     Head: Normocephalic.     Mouth/Throat:     Mouth: Mucous membranes are moist.  Cardiovascular:     Rate and Rhythm: Normal rate.     Heart sounds: Normal heart sounds.  Pulmonary:     Effort: Pulmonary effort is normal.     Breath sounds: Normal breath  sounds.  Neurological:     Mental Status: She is alert.     BP 113/80   Pulse 82   Ht 5\' 3"  (1.6 m)   Wt 208 lb 1.3 oz (94.4 kg)   SpO2 97%   BMI 36.86 kg/m  Wt Readings from Last 3 Encounters:  12/31/22 208 lb 1.3 oz (94.4 kg)  07/02/22 202 lb 0.6 oz (91.6 kg)  04/29/22 208 lb 0.6 oz (94.4 kg)    No results found for: "TSH" No results found for: "WBC", "HGB", "HCT", "MCV", "  PLT" Lab Results  Component Value Date   NA 140 11/30/2013   K 4.2 11/30/2013   CO2 27 11/30/2013   GLUCOSE 87 11/30/2013   BUN 8 11/30/2013   CREATININE 0.73 11/30/2013   CALCIUM 9.2 11/30/2013   No results found for: "CHOL" No results found for: "HDL" No results found for: "LDLCALC" No results found for: "TRIG" No results found for: "CHOLHDL" No results found for: "HGBA1C"    Assessment & Plan:  Acute vaginitis Assessment & Plan: Onset of symptoms 2 weeks ago She reports vaginal itching with white odorless discharge Last sexual intercourse was 12/29/2022 Denies vaginal soreness, external vulvar burning, and external dysuria Pending NuSwab    Candidiasis of vulva and vagina -     NuSwab Vaginitis Plus (VG+)  Pharyngitis due to other organism Assessment & Plan: No complaints or concerns today   Immunization due -     HPV 9-valent vaccine,Recombinat  Vitamin D deficiency -     VITAMIN D 25 Hydroxy (Vit-D Deficiency, Fractures)  IFG (impaired fasting glucose) -     Hemoglobin A1c  Other hyperlipidemia -     CBC with Differential/Platelet -     CMP14+EGFR -     Lipid panel  Other specified disorders of thyroid -     TSH + free T4    Follow-up: Return in about 3 months (around 04/02/2023) for CPE.   Gilmore Laroche, FNP

## 2022-12-31 NOTE — Progress Notes (Signed)
Established Patient Office Visit  Subjective:  Patient ID: Kelsey Abbott, female    DOB: Oct 17, 1995  Age: 27 y.o. MRN: [id number]  CC:  Chief Complaint  Patient presents with   Chronic Care Management    6 month f/u   Vaginal Itching    Pt reports yeast infection sx x 2 weeks ago.     HPI Kelsey Abbott is a 27 y.o. female presents with complaints of vaginal itching.   For the details of today's visit, please refer to the assessment and plan.    Past Medical History:  Diagnosis Date   Acne vulgaris 09/29/2014   Allergy    Asthma    mother says asthma was ruled  out.   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   Panic attacks 04/25/2013   Right ACL tear 04/17/2016   SOB (shortness of breath) 03/23/2013    Past Surgical History:  Procedure Laterality Date   KNEE ARTHROSCOPY WITH ANTERIOR CRUCIATE LIGAMENT (ACL) REPAIR WITH HAMSTRING GRAFT Right 04/17/2016   Procedure: Right KNEE ARTHROSCOPY WITH ANTERIOR CRUCIATE LIGAMENT (ACL) REPAIR WITH HAMSTRING GRAFT;  Surgeon: Teryl Lucy, MD;  Location: [facility];  Service: Orthopedics;  Laterality: Right;  Pre/Post Op femoral nerve block   KNEE ARTHROSCOPY WITH MEDIAL MENISECTOMY Right 04/17/2016   Procedure: KNEE ARTHROSCOPY WITH PARTIAL MEDIAL MENISECTOMY;  Surgeon: Teryl Lucy, MD;  Location: [facility];  Service: Orthopedics;  Laterality: Right;   KNEE ARTHROSCOPY WITH MEDIAL PATELLAR FEMORAL LIGAMENT RECONSTRUCTION Right 04/17/2016   Procedure: KNEE ARTHROSCOPY WITH MEDIAL PATELLAR FEMORAL LIGAMENT REEFING;  Surgeon: Teryl Lucy, MD;  Location: [facility];  Service: Orthopedics;  Laterality: Right;    Family History  Problem Relation Age of Onset   Cancer Mother 56       breast cancer   Hypertension Maternal Grandmother    Hypertension Maternal Aunt     Social History   Socioeconomic History   Marital status: Single    Spouse name: Not on file   Number of children: Not on file   Years of education: Not on file   Highest education level: GED or equivalent  Occupational History   Not on file  Tobacco Use   Smoking status: Former    Types: Cigars   Smokeless tobacco: Never  Vaping Use   Vaping Use: Never used  Substance and Sexual Activity   Alcohol use: Yes    Comment: occasionally   Drug use: Yes    Types: Marijuana    Comment: occassionally   Sexual activity: Yes    Birth control/protection: None, Injection  Other Topics Concern   Not on file  Social History Narrative   Lives with Vickii Chafe       Enjoys: chill      Diet: eats all food outside seafood,    Caffeine: limited to none   Water: 6-8 cups daily      Wears seat belt   Does not use phone while driving    Smoke detectors at home   No guns  Social Determinants of Health   Financial Resource Strain: Medium Risk (12/31/2022)   Overall Financial Resource Strain (CARDIA)    Difficulty of Paying Living Expenses: Somewhat hard  Food Insecurity: Food Insecurity Present (12/31/2022)   Hunger Vital Sign    Worried About Running Out of Food in the Last Year: Patient declined    Ran Out of Food in the Last Year: Sometimes true  Transportation Needs: No Transportation Needs (12/31/2022)   PRAPARE - Administrator, Civil Service (Medical): No    Lack of Transportation (Non-Medical): No  Physical Activity: Unknown (12/31/2022)   Exercise Vital Sign    Days of Exercise per Week: 0 days    Minutes of Exercise per Session: Not on file  Stress: No Stress Concern Present (12/31/2022)   [employer] of Occupational Health - Occupational Stress Questionnaire    Feeling of Stress : Not at all  Social Connections: Unknown (12/31/2022)   Social Connection and Isolation Panel [NHANES]    Frequency of Communication with Friends and Family: More than three times a week    Frequency of Social Gatherings with Friends and Family: More than three times a week    Attends Religious Services: 1 to 4 times per year    Active Member of [employer] or Organizations: No    Attends Banker Meetings: Not on file    Marital Status: Patient declined  Intimate Partner Violence: Not At Risk (09/04/2020)   Humiliation, Afraid, Rape, and Kick questionnaire    Fear of Current or Ex-Partner: No    Emotionally Abused: No    Physically Abused: No    Sexually Abused: No    Outpatient Medications Prior to Visit  Medication Sig Dispense Refill   fluconazole (DIFLUCAN) 150 MG tablet Take 1 now and 1 in 3 days (Patient not taking: Reported on 12/31/2022) 2 tablet 1   metroNIDAZOLE (FLAGYL) 500 MG tablet Take 1 tablet (500 mg total) by mouth 2 (two) times daily. (Patient not taking: Reported on 12/31/2022) 14 tablet 0   No facility-administered medications prior to visit.    No Known Allergies  ROS Review of Systems  Constitutional:  Negative for chills, fatigue and fever.  HENT:  Negative for congestion, ear pain and hearing loss.   Eyes:  Negative for visual disturbance.  Respiratory:  Negative for chest tightness and shortness of breath.   Gastrointestinal:  Negative for blood in stool, constipation, diarrhea, nausea and vomiting.  Genitourinary:  Positive for vaginal discharge. Negative for frequency, hematuria, menstrual problem, urgency and vaginal bleeding.  Musculoskeletal:  Negative for back pain and gait problem.  Neurological:  Negative for dizziness and headaches.  Psychiatric/Behavioral:  Negative for sleep disturbance and suicidal ideas.       Objective:    Physical  Exam HENT:     Head: Normocephalic.     Mouth/Throat:     Mouth: Mucous membranes are moist.  Cardiovascular:     Rate and Rhythm: Normal rate.     Heart sounds: Normal heart sounds.  Pulmonary:     Effort: Pulmonary effort is normal.     Breath sounds: Normal breath sounds.  Neurological:     Mental Status: She is alert.     BP 113/80   Pulse 82   Ht 5\' 3"  (1.6 m)   Wt 208 lb 1.3 oz (94.4 kg)   SpO2 97%   BMI 36.86 kg/m  Wt Readings from Last 3 Encounters:  12/31/22 208 lb 1.3  oz (94.4 kg)  07/02/22 202 lb 0.6 oz (91.6 kg)  04/29/22 208 lb 0.6 oz (94.4 kg)    No results found for: "TSH" No results found for: "WBC", "HGB", "HCT", "MCV", "PLT" Lab Results  Component Value Date   NA 140 11/30/2013   K 4.2 11/30/2013   CO2 27 11/30/2013   GLUCOSE 87 11/30/2013   BUN 8 11/30/2013   CREATININE 0.73 11/30/2013   CALCIUM 9.2 11/30/2013   No results found for: "CHOL" No results found for: "HDL" No results found for: "LDLCALC" No results found for: "TRIG" No results found for: "CHOLHDL" No results found for: "HGBA1C"    Assessment & Plan:  Acute vaginitis Assessment & Plan: Onset of symptoms 2 weeks ago She reports vaginal itching with white odorless discharge Last sexual intercourse was 12/29/2022 Denies vaginal soreness, external vulvar burning, and external dysuria Pending NuSwab    Candidiasis of vulva and vagina -     NuSwab Vaginitis Plus (VG+)  Pharyngitis due to other organism Assessment & Plan: No complaints or concerns today   Immunization due -     HPV 9-valent vaccine,Recombinat  Vitamin D deficiency -     VITAMIN D 25 Hydroxy (Vit-D Deficiency, Fractures)  IFG (impaired fasting glucose) -     Hemoglobin A1c  Other hyperlipidemia -     CBC with Differential/Platelet -     CMP14+EGFR -     Lipid panel  Other specified disorders of thyroid -     TSH + free T4    Follow-up: Return in about 3 months (around 04/02/2023) for CPE.    Gilmore Laroche, FNP

## 2022-12-31 NOTE — Patient Instructions (Signed)
I appreciate the opportunity to provide care to you today!    Follow up:  3 month CPE  Labs: please stop by the lab during the week to get your blood drawn (CBC, CMP, TSH, Lipid profile, HgA1c, Vit D)    We will let you know the result of your Nuswab on My[facility]    Please continue to a heart-healthy diet and increase your physical activities. Try to exercise for at least five days a week.      It was a pleasure to see you and I look forward to continuing to work together on your health and well-being. Please do not hesitate to call the office if you need care or have questions about your care.   Have a wonderful day and week. With Gratitude, Gilmore Laroche MSN, FNP-BC

## 2023-01-06 ENCOUNTER — Other Ambulatory Visit: Payer: Self-pay | Admitting: Family Medicine

## 2023-01-06 DIAGNOSIS — B9689 Other specified bacterial agents as the cause of diseases classified elsewhere: Secondary | ICD-10-CM

## 2023-01-06 MED ORDER — METRONIDAZOLE 500 MG PO TABS: 500.0000 mg | ORAL_TABLET | Freq: Two times a day (BID) | ORAL | 0 refills | Status: DC

## 2023-01-06 NOTE — Progress Notes (Signed)
Please inform the patient that prescription for Flagyl 500 mg twice daily is sent to her pharmacy to take for bacterial vaginosis.  Her swab was negative for gonorrhea, trichomonas, and chlamydia

## 2023-01-08 LAB — NUSWAB VAGINITIS PLUS (VG+)
Atopobium vaginae: HIGH Score — AB
BVAB 2: HIGH Score — AB
Candida albicans, NAA: POSITIVE — AB
Candida glabrata, NAA: NEGATIVE
Chlamydia trachomatis, NAA: NEGATIVE
Neisseria gonorrhoeae, NAA: NEGATIVE
Trich vag by NAA: NEGATIVE

## 2023-01-22 ENCOUNTER — Encounter: Payer: Self-pay | Admitting: Family Medicine

## 2023-01-23 ENCOUNTER — Other Ambulatory Visit: Payer: Self-pay | Admitting: Family Medicine

## 2023-01-23 DIAGNOSIS — Z3141 Encounter for fertility testing: Secondary | ICD-10-CM

## 2023-01-26 ENCOUNTER — Ambulatory Visit: Payer: Self-pay | Admitting: Family Medicine

## 2023-01-26 NOTE — Progress Notes (Deleted)
Acute Office Visit  Subjective:    Patient ID: Kelsey Abbott, female    DOB: Dec 20, 1995, 27 y.o.   MRN: [id number]  No chief complaint on file.   HPI Patient is in today for ***  Past Medical History:  Diagnosis Date   Acne vulgaris 09/29/2014   Allergy    Asthma    mother says asthma was ruled  out.   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   Panic attacks 04/25/2013   Right ACL tear 04/17/2016   SOB (shortness of breath) 03/23/2013    Past Surgical History:  Procedure Laterality Date   KNEE ARTHROSCOPY WITH ANTERIOR CRUCIATE LIGAMENT (ACL) REPAIR WITH HAMSTRING GRAFT Right 04/17/2016   Procedure: Right KNEE ARTHROSCOPY WITH ANTERIOR CRUCIATE LIGAMENT (ACL) REPAIR WITH HAMSTRING GRAFT;  Surgeon: Teryl Lucy, MD;  Location: [facility];  Service: Orthopedics;  Laterality: Right;  Pre/Post Op femoral nerve block   KNEE ARTHROSCOPY WITH MEDIAL MENISECTOMY Right 04/17/2016   Procedure: KNEE ARTHROSCOPY WITH PARTIAL MEDIAL MENISECTOMY;  Surgeon: Teryl Lucy, MD;  Location: [facility];  Service: Orthopedics;  Laterality: Right;   KNEE ARTHROSCOPY WITH MEDIAL PATELLAR FEMORAL LIGAMENT RECONSTRUCTION Right 04/17/2016   Procedure: KNEE ARTHROSCOPY WITH MEDIAL PATELLAR FEMORAL LIGAMENT REEFING;  Surgeon: Teryl Lucy, MD;  Location: [facility];  Service: Orthopedics;  Laterality: Right;    Family History  Problem Relation Age of Onset   Cancer Mother 41       breast cancer   Hypertension Maternal Grandmother    Hypertension Maternal Aunt     Social History    Socioeconomic History   Marital status: Single    Spouse name: Not on file   Number of children: Not on file   Years of education: Not on file   Highest education level: GED or equivalent  Occupational History   Not on file  Tobacco Use   Smoking status: Former    Types: Cigars   Smokeless tobacco: Never  Vaping Use   Vaping Use: Never used  Substance and Sexual Activity   Alcohol use: Yes    Comment: occasionally   Drug use: Yes    Types: Marijuana    Comment: occassionally   Sexual activity: Yes    Birth control/protection: None, Injection  Other Topics Concern   Not on file  Social History Narrative   Lives with Vickii Chafe       Enjoys: chill      Diet: eats all food outside seafood,    Caffeine: limited to none   Water: 6-8 cups daily      Wears seat belt   Does not use phone while driving    Smoke detectors at home   No guns       Social Determinants of Health   Financial Resource Strain: Medium Risk (12/31/2022)   Overall Financial Resource Strain (CARDIA)    Difficulty of Paying Living Expenses: Somewhat hard  Food Insecurity: Food Insecurity Present (12/31/2022)   Hunger Vital Sign    Worried About Programme researcher, broadcasting/film/video in  the Last Year: Patient declined    Ran Out of Food in the Last Year: Sometimes true  Transportation Needs: No Transportation Needs (12/31/2022)   PRAPARE - Administrator, Civil Service (Medical): No    Lack of Transportation (Non-Medical): No  Physical Activity: Unknown (12/31/2022)   Exercise Vital Sign    Days of Exercise per Week: 0 days    Minutes of Exercise per Session: Not on file  Stress: No Stress Concern Present (12/31/2022)   [employer] of Occupational Health - Occupational Stress Questionnaire    Feeling of Stress : Not at all  Social Connections: Unknown (12/31/2022)   Social Connection and Isolation Panel [NHANES]    Frequency of Communication with Friends and Family: More than three times a week     Frequency of Social Gatherings with Friends and Family: More than three times a week    Attends Religious Services: 1 to 4 times per year    Active Member of [employer] or Organizations: No    Attends Banker Meetings: Not on file    Marital Status: Patient declined  Intimate Partner Violence: Not At Risk (09/04/2020)   Humiliation, Afraid, Rape, and Kick questionnaire    Fear of Current or Ex-Partner: No    Emotionally Abused: No    Physically Abused: No    Sexually Abused: No    Outpatient Medications Prior to Visit  Medication Sig Dispense Refill   fluconazole (DIFLUCAN) 150 MG tablet Take 1 now and 1 in 3 days (Patient not taking: Reported on 12/31/2022) 2 tablet 1   metroNIDAZOLE (FLAGYL) 500 MG tablet Take 1 tablet (500 mg total) by mouth 2 (two) times daily. 14 tablet 0   No facility-administered medications prior to visit.    No Known Allergies  Review of Systems     Objective:    Physical Exam  There were no vitals taken for this visit. Wt Readings from Last 3 Encounters:  12/31/22 208 lb 1.3 oz (94.4 kg)  07/02/22 202 lb 0.6 oz (91.6 kg)  04/29/22 208 lb 0.6 oz (94.4 kg)       Assessment & Plan:  There are no diagnoses linked to this encounter.  Gilmore Laroche, FNP

## 2023-02-05 ENCOUNTER — Ambulatory Visit (INDEPENDENT_AMBULATORY_CARE_PROVIDER_SITE_OTHER): Payer: Self-pay | Admitting: Obstetrics & Gynecology

## 2023-02-05 ENCOUNTER — Encounter: Payer: Self-pay | Admitting: Obstetrics & Gynecology

## 2023-02-05 VITALS — BP 121/78 | Ht 63.0 in

## 2023-02-05 DIAGNOSIS — Z3169 Encounter for other general counseling and advice on procreation: Secondary | ICD-10-CM

## 2023-02-05 NOTE — Progress Notes (Signed)
Concerns regarding getting pregnant:   Chief Complaint  Patient presents with   discuss getting pregnant    Only been actively trying for 2 months,having regular periods, Progesterone last checked 2022    Blood pressure 121/78, height 5\' 3"  (1.6 m), last menstrual period 01/11/2023.  Pt has regular cycles, monthly + normal bleeding duration and volume Had normal progesterones x 2 2022 No pain with intercourse Has intercourse >2-3/week Same partner since 2017 1 chlamydia infection 2021, no prior or since, several cultures done Partner has not fathered any children, no medical issues or previous cnacer    MEDS ordered this encounter: No orders of the defined types were placed in this encounter.   Orders for this encounter: No orders of the defined types were placed in this encounter.   Impression + Management Plan   ICD-10-CM   1. Infertility counseling  Z31.69    recommend semen analysis initially and then proceed with HSG.  I have reached out to Dr Ronne Binning to ask about getting a semen eval done      Follow Up: Return if symptoms worsen or fail to improve.     All questions were answered.  Past Medical History:  Diagnosis Date   Acne vulgaris 09/29/2014   Allergy    Asthma    mother says asthma was ruled  out.   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   Panic attacks 04/25/2013   Right ACL tear 04/17/2016   SOB (shortness of breath) 03/23/2013    Past Surgical History:  Procedure Laterality Date   KNEE ARTHROSCOPY WITH ANTERIOR CRUCIATE LIGAMENT (ACL) REPAIR WITH HAMSTRING GRAFT  Right 04/17/2016   Procedure: Right KNEE ARTHROSCOPY WITH ANTERIOR CRUCIATE LIGAMENT (ACL) REPAIR WITH HAMSTRING GRAFT;  Surgeon: Teryl Lucy, MD;  Location: [facility];  Service: Orthopedics;  Laterality: Right;  Pre/Post Op femoral nerve block   KNEE ARTHROSCOPY WITH MEDIAL MENISECTOMY Right 04/17/2016   Procedure: KNEE ARTHROSCOPY WITH PARTIAL MEDIAL MENISECTOMY;  Surgeon: Teryl Lucy, MD;  Location: [facility];  Service: Orthopedics;  Laterality: Right;   KNEE ARTHROSCOPY WITH MEDIAL PATELLAR FEMORAL LIGAMENT RECONSTRUCTION Right 04/17/2016   Procedure: KNEE ARTHROSCOPY WITH MEDIAL PATELLAR FEMORAL LIGAMENT REEFING;  Surgeon: Teryl Lucy, MD;  Location: [facility];  Service: Orthopedics;  Laterality: Right;    OB History     Gravida  0   Para  0   Term  0   Preterm  0   AB  0   Living  0      SAB  0   IAB  0   Ectopic  0   Multiple  0   Live Births  0           No Known Allergies  Social History   Socioeconomic History   Marital status: Single    Spouse name: Not on file   Number of children: Not on file   Years of education: Not on file   Highest education level: GED or equivalent  Occupational History   Not on file  Tobacco  Use   Smoking status: Former    Types: Cigars   Smokeless tobacco: Never  Vaping Use   Vaping Use: Never used  Substance and Sexual Activity   Alcohol use: Yes    Comment: occasionally   Drug use: Not Currently    Types: Marijuana    Comment: occassionally   Sexual activity: Yes    Birth control/protection: None  Other Topics Concern   Not on file  Social History Narrative   Lives with Vickii Chafe       Enjoys: chill      Diet: eats all food outside seafood,    Caffeine: limited to none   Water: 6-8 cups daily      Wears seat belt   Does not use phone while driving    Smoke detectors at home   No guns       Social Determinants of Health   Financial Resource  Strain: Medium Risk (12/31/2022)   Overall Financial Resource Strain (CARDIA)    Difficulty of Paying Living Expenses: Somewhat hard  Food Insecurity: Food Insecurity Present (12/31/2022)   Hunger Vital Sign    Worried About Running Out of Food in the Last Year: Patient declined    Ran Out of Food in the Last Year: Sometimes true  Transportation Needs: No Transportation Needs (12/31/2022)   PRAPARE - Administrator, Civil Service (Medical): No    Lack of Transportation (Non-Medical): No  Physical Activity: Unknown (12/31/2022)   Exercise Vital Sign    Days of Exercise per Week: 0 days    Minutes of Exercise per Session: Not on file  Stress: No Stress Concern Present (12/31/2022)   [employer] of Occupational Health - Occupational Stress Questionnaire    Feeling of Stress : Not at all  Social Connections: Unknown (12/31/2022)   Social Connection and Isolation Panel [NHANES]    Frequency of Communication with Friends and Family: More than three times a week    Frequency of Social Gatherings with Friends and Family: More than three times a week    Attends Religious Services: 1 to 4 times per year    Active Member of [employer] or Organizations: No    Attends Engineer, structural: Not on file    Marital Status: Patient declined    Family History  Problem Relation Age of Onset   Cancer Mother 38       breast cancer   Hypertension Maternal Grandmother    Hypertension Maternal Aunt

## 2023-02-09 ENCOUNTER — Ambulatory Visit: Payer: Self-pay | Admitting: Family Medicine

## 2023-04-06 ENCOUNTER — Encounter: Payer: Self-pay | Admitting: Family Medicine

## 2023-04-07 ENCOUNTER — Encounter: Payer: Self-pay | Admitting: Family Medicine

## 2023-05-04 ENCOUNTER — Encounter: Payer: Self-pay | Admitting: Family Medicine

## 2023-06-02 ENCOUNTER — Encounter: Payer: Self-pay | Admitting: Family Medicine

## 2023-06-02 ENCOUNTER — Other Ambulatory Visit (HOSPITAL_COMMUNITY)
Admission: RE | Admit: 2023-06-02 | Discharge: 2023-06-02 | Disposition: A | Discharge status: Home / Self Care | Payer: Self-pay | Source: Ambulatory Visit | Attending: Family Medicine | Admitting: Family Medicine

## 2023-06-02 ENCOUNTER — Ambulatory Visit (INDEPENDENT_AMBULATORY_CARE_PROVIDER_SITE_OTHER): Payer: Self-pay | Admitting: Family Medicine

## 2023-06-02 VITALS — BP 128/83 | HR 76 | Ht 63.0 in | Wt 214.1 lb

## 2023-06-02 DIAGNOSIS — Z0001 Encounter for general adult medical examination with abnormal findings: Secondary | ICD-10-CM

## 2023-06-02 DIAGNOSIS — M545 Low back pain, unspecified: Secondary | ICD-10-CM

## 2023-06-02 DIAGNOSIS — Z124 Encounter for screening for malignant neoplasm of cervix: Secondary | ICD-10-CM | POA: Insufficient documentation

## 2023-06-02 DIAGNOSIS — Z23 Encounter for immunization: Secondary | ICD-10-CM

## 2023-06-02 MED ORDER — CYCLOBENZAPRINE HCL 5 MG PO TABS: 5.0000 mg | ORAL_TABLET | Freq: Every day | ORAL | 1 refills | Status: DC

## 2023-06-02 NOTE — Assessment & Plan Note (Signed)
The patient complains of occasional low back pain, which she describes as a feeling of tightness. She reports no recent injury or trauma. Additionally, she denies any numbness, tingling, or bowel/bladder incontinence. Today, she will be treated with Flexeril 5 mg to take at bedtime. She is also encouraged to take over-the-counter Tylenol or ibuprofen as needed for pain relief. Nonpharmacological management, including heat therapy, posture improvement, and weight management, was reviewed with her.

## 2023-06-02 NOTE — Progress Notes (Signed)
Complete physical exam  Patient: Kelsey Abbott   DOB: 04-10-1996   27 y.o. Female  MRN: [id number]  Subjective:    Chief Complaint  Patient presents with   Annual Exam    Cpe    Back Pain    Pt reports low back pain since 05/30/2023    Kelsey Abbott is a 27 y.o. female who presents today for a complete physical exam. She reports consuming a general diet. The patient has a physically strenuous job, but has no regular exercise apart from work.  She generally feels well. She reports sleeping well. She does not have additional problems to discuss today.   Most recent fall risk assessment:    06/02/2023    8:00 AM  Fall Risk   Falls in the past year? 0  Number falls in past yr: 0  Injury with Fall? 0  Risk for fall due to : No Fall Risks  Follow up Falls evaluation completed     Most recent depression screenings:    06/02/2023    8:11 AM 06/02/2023    8:00 AM  PHQ 2/9 Scores  PHQ - 2 Score 2 0  PHQ- 9 Score 4 0    Dental: No current dental problems and Last dental visit: 12/2022  Patient Active Problem List   Diagnosis Date Noted   Acute midline low back pain without sciatica 06/02/2023   Cervical cancer screening 06/02/2023   Encounter for immunization 06/02/2023   Otitis media 07/02/2022   Pharyngitis 04/29/2022   Blurred vision, bilateral 04/25/2022   Vulvovaginal candidiasis 03/24/2022   Chlamydia 05/24/2021   Encounter for initial prescription of injectable contraceptive 05/20/2021   Encounter for annual general medical examination with abnormal findings in adult 05/20/2021   Patient desires pregnancy 09/04/2020   Acute vaginitis 04/06/2020   Need for immunization against influenza 04/06/2020   Throat irritation 01/11/2020   Class 1 obesity due to excess calories without serious comorbidity with body mass index (BMI) of 32.0 to 32.9 in adult 12/29/2019   Past Medical History:  Diagnosis Date   Acne vulgaris 09/29/2014   Allergy    Asthma    mother  says asthma was ruled  out.   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   Panic attacks 04/25/2013   Right ACL tear 04/17/2016   SOB (shortness of breath) 03/23/2013   Past Surgical History:  Procedure Laterality Date   KNEE ARTHROSCOPY WITH ANTERIOR CRUCIATE LIGAMENT (ACL) REPAIR WITH HAMSTRING GRAFT Right 04/17/2016   Procedure: Right KNEE ARTHROSCOPY WITH ANTERIOR CRUCIATE LIGAMENT (ACL) REPAIR WITH HAMSTRING GRAFT;  Surgeon: Teryl Lucy, MD;  Location: [facility];  Service: Orthopedics;  Laterality: Right;  Pre/Post Op femoral nerve block   KNEE ARTHROSCOPY WITH MEDIAL MENISECTOMY Right 04/17/2016   Procedure: KNEE ARTHROSCOPY WITH PARTIAL MEDIAL MENISECTOMY;  Surgeon: Teryl Lucy, MD;  Location: [facility];  Service: Orthopedics;  Laterality: Right;   KNEE ARTHROSCOPY WITH MEDIAL PATELLAR FEMORAL LIGAMENT RECONSTRUCTION Right 04/17/2016   Procedure: KNEE ARTHROSCOPY WITH MEDIAL PATELLAR FEMORAL LIGAMENT REEFING;  Surgeon: Ivin Booty  Dion Saucier, MD;  Location: [facility];  Service: Orthopedics;  Laterality: Right;   Social History   Tobacco Use   Smoking status: Former    Types: Cigars   Smokeless tobacco: Never  Vaping Use   Vaping status: Never Used  Substance Use Topics   Alcohol use: Yes    Comment: occasionally   Drug use: Not Currently    Types: Marijuana    Comment: occassionally   Social History   Socioeconomic History   Marital status: Single    Spouse name: Not on file   Number of children: Not on file   Years of education: Not on file   Highest education  level: 12th grade  Occupational History   Not on file  Tobacco Use   Smoking status: Former    Types: Cigars   Smokeless tobacco: Never  Vaping Use   Vaping status: Never Used  Substance and Sexual Activity   Alcohol use: Yes    Comment: occasionally   Drug use: Not Currently    Types: Marijuana    Comment: occassionally   Sexual activity: Yes    Birth control/protection: None  Other Topics Concern   Not on file  Social History Narrative   Lives with Vickii Chafe       Enjoys: chill      Diet: eats all food outside seafood,    Caffeine: limited to none   Water: 6-8 cups daily      Wears seat belt   Does not use phone while driving    Smoke detectors at home   No guns       Social Determinants of Health   Financial Resource Strain: Medium Risk (06/01/2023)   Overall Financial Resource Strain (CARDIA)    Difficulty of Paying Living Expenses: Somewhat hard  Food Insecurity: Food Insecurity Present (06/01/2023)   Hunger Vital Sign    Worried About Running Out of Food in the Last Year: Sometimes true    Ran Out of Food in the Last Year: Sometimes true  Transportation Needs: Unmet Transportation Needs (06/01/2023)   PRAPARE - Transportation    Lack of Transportation (Medical): No    Lack of Transportation (Non-Medical): Yes  Physical Activity: Insufficiently Active (06/01/2023)   Exercise Vital Sign    Days of Exercise per Week: 2 days    Minutes of Exercise per Session: 30 min  Stress: No Stress Concern Present (06/01/2023)   [employer] of Occupational Health - Occupational Stress Questionnaire    Feeling of Stress : Only a little  Social Connections: Unknown (06/01/2023)   Social Connection and Isolation Panel [NHANES]    Frequency of Communication with Friends and Family: More than three times a week    Frequency of Social Gatherings with Friends and Family: More than three times a week    Attends Religious Services: 1 to 4 times per year    Active  Member of [employer] or Organizations: No    Attends Banker Meetings: Not on file    Marital Status: Patient declined  Intimate Partner Violence: Not At Risk (09/04/2020)   Humiliation, Afraid, Rape, and Kick questionnaire    Fear of Current or Ex-Partner: No    Emotionally Abused: No    Physically Abused: No    Sexually Abused: No   Family Status  Relation Name Status   Mother  Alive   Father  Alive   MGM  Alive   MGF  Deceased   PGM  Deceased   PGF  Deceased   Mat Aunt  Alive  No partnership data on file   Family History  Problem Relation Age of Onset   Cancer Mother 57       breast cancer   Hypertension Maternal Grandmother    Hypertension Maternal Aunt    No Known Allergies    Patient Care Team: Gilmore Laroche, FNP as PCP - General (Family Medicine)   No outpatient medications prior to visit.   No facility-administered medications prior to visit.    Review of Systems  Constitutional:  Negative for chills, fever and malaise/fatigue.  HENT:  Negative for congestion and sinus pain.   Eyes:  Negative for pain, discharge and redness.  Respiratory:  Negative for cough, sputum production and shortness of breath.   Cardiovascular:  Negative for chest pain, palpitations, claudication and leg swelling.  Gastrointestinal:  Negative for diarrhea, heartburn and nausea.  Genitourinary:  Negative for flank pain, frequency and urgency.  Musculoskeletal:  Positive for back pain. Negative for joint pain.  Skin:  Negative for itching.  Neurological:  Negative for dizziness, seizures and headaches.  Endo/Heme/Allergies:  Negative for environmental allergies.  Psychiatric/Behavioral:  Negative for memory loss. The patient does not have insomnia.        Objective:    BP 128/83   Pulse 76   Ht 5\' 3"  (1.6 m)   Wt 214 lb 1.9 oz (97.1 kg)   SpO2 97%   BMI 37.93 kg/m  BP Readings from Last 3 Encounters:  06/02/23 128/83  02/05/23 121/78  12/31/22 113/80   Wt  Readings from Last 3 Encounters:  06/02/23 214 lb 1.9 oz (97.1 kg)  12/31/22 208 lb 1.3 oz (94.4 kg)  07/02/22 202 lb 0.6 oz (91.6 kg)      Physical Exam HENT:     Head: Normocephalic.     Left Ear: External ear normal.     Mouth/Throat:     Mouth: Mucous membranes are moist.  Eyes:     Extraocular Movements: Extraocular movements intact.     Pupils: Pupils are equal, round, and reactive to light.  Cardiovascular:     Rate and Rhythm: Normal rate and regular rhythm.     Heart sounds: No murmur heard. Pulmonary:     Effort: Pulmonary effort is normal.     Breath sounds: Normal breath sounds.  Abdominal:     Palpations: Abdomen is soft.     Tenderness: There is no right CVA tenderness or left CVA tenderness.  Genitourinary:    Exam position: Lithotomy position.     Labia:        Right: No lesion.        Left: No lesion.      Comments: Vaginal wall: pink and rugated, smooth and non-tender; absence of lesions, edema, and erythema. Labia Majora and Minora: present bilaterally, moist, soft tissue, and homogeneous; free of edema and ulcerations. Clitoris is anatomically present, above the urethral, and free of lesions, masses, and ulceration.  Musculoskeletal:     Lumbar back: Spasms present.     Right lower leg: No edema.     Left lower leg: No edema.  Skin:    Findings: No lesion or rash.  Neurological:     Mental Status: She is alert and oriented to person, place, and time.     GCS: GCS eye subscore is 4. GCS verbal subscore is 5. GCS motor subscore is 6.     Cranial Nerves: No facial  asymmetry.     Sensory: No sensory deficit.     Motor: No weakness.     Coordination: Coordination normal. Finger-Nose-Finger Test normal.     Gait: Gait normal.  Psychiatric:        Judgment: Judgment normal.     No results found for any visits on 06/02/23.      Assessment & Plan:    Routine Health Maintenance and Physical Exam  Immunization History  Administered Date(s)  Administered   DTaP 06/12/1998, 08/31/1998, 10/25/1998, 05/08/1999   HIB (PRP-OMP) 06/12/1998, 08/21/1998, 05/08/1999   HPV 9-valent 06/29/2015, 03/24/2022, 12/31/2022   Hepatitis A, Ped/Adol-2 Dose 06/29/2015   Hepatitis B 10/25/1998, 02/15/1999, 08/08/1999   IPV 06/12/1998, 08/21/1998, 05/08/1999, 08/08/1999   Influenza Nasal 06/06/2008   Influenza Whole 07/15/2011, 05/04/2012   Influenza, Seasonal, Injecte, Preservative Fre 06/02/2023   Influenza,inj,Quad PF,6+ Mos 04/05/2020, 07/02/2022   MMR 05/08/1999   Meningococcal Conjugate 06/29/2015   Moderna Sars-Covid-2 Vaccination 03/26/2020   Tdap 04/17/2009, 03/24/2022   Varicella 05/18/2000    Health Maintenance  Topic Date Due   Hepatitis C Screening  Never done   Cervical Cancer Screening (Pap smear)  03/10/2023   COVID-19 Vaccine (2 - 2023-24 season) 04/12/2023   DTaP/Tdap/Td (7 - Td or Tdap) 03/24/2032   INFLUENZA VACCINE  Completed   HPV VACCINES  Completed   HIV Screening  Completed    Discussed health benefits of physical activity, and encouraged her to engage in regular exercise appropriate for her age and condition.  Encounter for annual general medical examination with abnormal findings in adult Assessment & Plan: Physical exam as documented Discussed heart-healthy diet  Encouraged to Exercise: If you are able: 30 -60 minutes a day ,4 days a week, or 150 minutes a week. The longer the better. Combine stretch, strength, and aerobic activities Encourage to eat whole Food, Plant Predominant Nutrition is highly recommended: Eat Plenty of vegetables, Mushrooms, fruits, Legumes, Whole Grains, Nuts, seeds in lieu of processed meats, processed snacks/pastries red meat, poultry, eggs.  Will f/u in 1 year for CPE      Acute midline low back pain without sciatica Assessment & Plan: The patient complains of occasional low back pain, which she describes as a feeling of tightness. She reports no recent injury or trauma.  Additionally, she denies any numbness, tingling, or bowel/bladder incontinence. Today, she will be treated with Flexeril 5 mg to take at bedtime. She is also encouraged to take over-the-counter Tylenol or ibuprofen as needed for pain relief. Nonpharmacological management, including heat therapy, posture improvement, and weight management, was reviewed with her.     Orders: -     Urinalysis -     Cyclobenzaprine HCl; Take 1 tablet (5 mg total) by mouth at bedtime.  Dispense: 30 tablet; Refill: 1  Cervical cancer screening Assessment & Plan: Pap smear has been completed. Testing will be repeated in 3 years if the results are negative  Orders: -     Cytology - PAP  Encounter for immunization Assessment & Plan: Patient educated on CDC recommendation for the vaccine. Verbal consent was obtained from the patient, vaccine administered by nurse, no sign of adverse reactions noted at this time. Patient education on arm soreness and use of tylenol or ibuprofen for this patient  was discussed. Patient educated on the signs and symptoms of adverse effect and advise to contact the office if they occur.   Orders: -     Flu vaccine trivalent PF, 6mos and older(Flulaval,Afluria,Fluarix,Fluzone)  Return in about 6 months (around 12/01/2023). Note: This chart has been completed using Engineer, civil (consulting) software, and while attempts have been made to ensure accuracy, certain words and phrases may not be transcribed as intended.      Gilmore Laroche, FNP

## 2023-06-02 NOTE — Patient Instructions (Addendum)
I appreciate the opportunity to provide care to you today!    Follow up:  6 months   Here are several nonpharmacological interventions for managing back pain: -Physical Activity: Engage in regular low-impact exercises such as walking, swimming, or cycling to strengthen the back muscles and improve flexibility. -Stretching and Strengthening Exercises: Incorporate exercises that focus on strengthening the core and back muscles, as well as stretches to improve flexibility. Specific exercises can include: Cat-Cow stretch Child's pose Knee-to-chest stretch Hamstring stretches Hot and Cold Therapy: -Cold Therapy: Apply ice packs to the affected area for 15-20 minutes to reduce inflammation and numb the pain -Heat Therapy: use heat packs or warm baths to relax tight muscles and improve circulation. -Posture Improvement: Maintain proper posture while sitting, standing, and lifting to avoid additional strain on the back. Ergonomic chairs and lumbar support can help. -Weight Management: Maintain a healthy weight to reduce stress on the spine and lower back. -Sleep Hygiene: Ensure proper sleep positioning by using supportive pillows and mattresses to maintain spinal alignment during sleep.    Attached with your AVS, you will find valuable resources for self-education. I highly recommend dedicating some time to thoroughly examine them.   Please continue to a heart-healthy diet and increase your physical activities. Try to exercise for at least five days a week.    It was a pleasure to see you and I look forward to continuing to work together on your health and well-being. Please do not hesitate to call the office if you need care or have questions about your care.  In case of emergency, please visit the Emergency Department for urgent care, or contact our clinic at [phone] to schedule an appointment. We're here to help you!   Have a wonderful day and week. With Gratitude, Gilmore Laroche MSN, FNP-BC

## 2023-06-02 NOTE — Assessment & Plan Note (Signed)
Patient educated on CDC recommendation for the vaccine. Verbal consent was obtained from the patient, vaccine administered by nurse, no sign of adverse reactions noted at this time. Patient education on arm soreness and use of tylenol or ibuprofen for this patient  was discussed. Patient educated on the signs and symptoms of adverse effect and advise to contact the office if they occur.  

## 2023-06-02 NOTE — Assessment & Plan Note (Signed)
Pap smear has been completed. Testing will be repeated in 3 years if the results are negative

## 2023-06-02 NOTE — Assessment & Plan Note (Signed)

## 2023-06-04 LAB — CYTOLOGY - PAP
Chlamydia: NEGATIVE
Comment: NEGATIVE
Comment: NEGATIVE
Comment: NORMAL
Diagnosis: NEGATIVE
Neisseria Gonorrhea: NEGATIVE
Trichomonas: NEGATIVE

## 2023-06-05 ENCOUNTER — Encounter: Payer: Self-pay | Admitting: Family Medicine

## 2023-08-19 ENCOUNTER — Ambulatory Visit: Payer: Self-pay | Admitting: Family Medicine

## 2023-08-31 ENCOUNTER — Encounter: Payer: Self-pay | Admitting: Family Medicine

## 2023-09-04 ENCOUNTER — Other Ambulatory Visit: Payer: Self-pay

## 2023-11-19 ENCOUNTER — Ambulatory Visit

## 2023-11-19 VITALS — BP 129/82 | HR 78 | Ht 63.0 in | Wt 218.1 lb

## 2023-11-19 DIAGNOSIS — R109 Unspecified abdominal pain: Secondary | ICD-10-CM

## 2023-11-19 DIAGNOSIS — M545 Low back pain, unspecified: Secondary | ICD-10-CM | POA: Diagnosis not present

## 2023-11-19 MED ORDER — METHOCARBAMOL 750 MG PO TABS: 750.0000 mg | ORAL_TABLET | Freq: Three times a day (TID) | ORAL | 2 refills | Status: DC | PRN

## 2023-11-19 NOTE — Assessment & Plan Note (Signed)
 Acute on chronic.  She was prescribed Flexeril in the past, but it was very sedating.  Will add Robaxin in its place.  Recommend stretching exercises and walking to improve as well as weight reduction.  Recommend updating x-ray studies if no improvement.

## 2023-11-19 NOTE — Progress Notes (Signed)
 Established Patient Office Visit  Subjective   Patient ID: Kelsey Abbott, female    DOB: Sep 19, 1995  Age: 28 y.o. MRN: [id number]  Chief Complaint  Patient presents with   Medical Management of Chronic Issues    Pt states she has stomach cramps and lower back pain and migraines     HPI Back Pain: Patient presents for presents evaluation of low back problems.  Symptoms have been present for 5 days and include pain in middle low back (aching in character; 4/10 in severity). Initial inciting event: none. Symptoms are worst: mid-day. Alleviating factors identifiable by patient are standing. Exacerbating factors identifiable by patient are recumbency. Treatments so far initiated by patient: none Previous lower back problems:  low back . Previous workup:  x-ray . Previous treatments:  Flexeril .  Abdominal Pain: Patient complains of abdominal pain. The pain is described as cramping, and is 3/10 in intensity. Pain is located in the LLQ, RLQ without radiation. Onset was 1 day ago. Symptoms have been unchanged since. Aggravating factors: none.  Alleviating factors: none. Associated symptoms: none. The patient denies constipation, diarrhea, dysuria, fever, flatus, nausea, and vomiting.    Patient Active Problem List   Diagnosis Date Noted   Acute midline low back pain without sciatica 06/02/2023   Cervical cancer screening 06/02/2023   Encounter for immunization 06/02/2023   Otitis media 07/02/2022   Pharyngitis 04/29/2022   Blurred vision, bilateral 04/25/2022   Vulvovaginal candidiasis 03/24/2022   Chlamydia 05/24/2021   Encounter for initial prescription of injectable contraceptive 05/20/2021   Encounter for annual general medical examination with abnormal findings in adult 05/20/2021   Patient desires pregnancy 09/04/2020   Acute vaginitis 04/06/2020   Need for immunization against influenza 04/06/2020   Throat irritation 01/11/2020   Class 1 obesity due to excess calories without  serious comorbidity with body mass index (BMI) of 32.0 to 32.9 in adult 12/29/2019   Past Medical History:  Diagnosis Date   Acne vulgaris 09/29/2014   Allergy    Asthma    mother says asthma was ruled  out.   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    The ASCVD Risk score (Arnett DK, et al., 2019) failed to calculate for the following reasons:   The 2019 ASCVD risk score is only valid for ages 25 to  79    Assessment & Plan:   Problem List Items Addressed This Visit       Other   Acute midline low back pain without sciatica - Primary   Acute on chronic.  She was prescribed Flexeril in the past, but it was very sedating.  Will add Robaxin in its place.  Recommend stretching exercises and walking to improve as well as weight reduction.  Recommend updating x-ray studies if no improvement.       Relevant Medications   methocarbamol (ROBAXIN-750) 750 MG tablet   Other Visit Diagnoses       Abdominal cramping       unremarkable exam. not felt to be r/t menstrual cycle.   could be something she ate.  recommend OTC Pepto Bismol or Mylanta for indegestion.       No follow-ups on file.    Darral Dash, FNP

## 2023-11-30 ENCOUNTER — Ambulatory Visit: Payer: Self-pay

## 2023-12-01 ENCOUNTER — Ambulatory Visit: Payer: Self-pay | Admitting: Family Medicine

## 2024-01-18 ENCOUNTER — Ambulatory Visit: Payer: Self-pay | Admitting: Family Medicine

## 2024-03-17 ENCOUNTER — Ambulatory Visit: Payer: Self-pay | Admitting: Family Medicine

## 2024-04-25 ENCOUNTER — Ambulatory Visit
Admission: RE | Admit: 2024-04-25 | Discharge: 2024-04-25 | Disposition: A | Discharge status: Home / Self Care | Payer: Self-pay | Attending: Family Medicine | Admitting: Family Medicine

## 2024-04-25 ENCOUNTER — Other Ambulatory Visit: Payer: Self-pay

## 2024-04-25 VITALS — BP 124/83 | HR 109 | Temp 99.2°F | Resp 19

## 2024-04-25 DIAGNOSIS — N390 Urinary tract infection, site not specified: Secondary | ICD-10-CM | POA: Insufficient documentation

## 2024-04-25 DIAGNOSIS — J069 Acute upper respiratory infection, unspecified: Secondary | ICD-10-CM | POA: Insufficient documentation

## 2024-04-25 LAB — POCT URINE DIPSTICK
Bilirubin, UA: NEGATIVE
Blood, UA: NEGATIVE
Glucose, UA: NEGATIVE mg/dL
Nitrite, UA: POSITIVE — AB
POC PROTEIN,UA: 30 — AB
Spec Grav, UA: 1.025 (ref 1.010–1.025)
Urobilinogen, UA: 1 U/dL
pH, UA: 5.5 (ref 5.0–8.0)

## 2024-04-25 LAB — POC SOFIA SARS ANTIGEN FIA: SARS Coronavirus 2 Ag: NEGATIVE

## 2024-04-25 MED ORDER — PROMETHAZINE-DM 6.25-15 MG/5ML PO SYRP: 5.0000 mL | ORAL_SOLUTION | Freq: Four times a day (QID) | ORAL | 0 refills | Status: DC | PRN

## 2024-04-25 MED ORDER — AZELASTINE HCL 0.1 % NA SOLN: 1.0000 | Freq: Two times a day (BID) | NASAL | 0 refills | Status: AC

## 2024-04-25 MED ORDER — CEPHALEXIN 500 MG PO CAPS: 500.0000 mg | ORAL_CAPSULE | Freq: Two times a day (BID) | ORAL | 0 refills | Status: DC

## 2024-04-25 NOTE — Discharge Instructions (Signed)
 Your urine test today does show evidence of urinary tract infection.  I have placed you on some antibiotics for this and send out for a urine culture to get more information.  We will let you know if we need to make any changes to your medication.  For your upper respiratory symptoms, I have sent in some nasal spray and cough syrup and you may take over-the-counter cold and congestion medications as needed.  Follow-up for worsening symptoms.

## 2024-04-25 NOTE — ED Triage Notes (Signed)
 Pt reports being seeing UC for congestion, cough, abdominal pain, urinary frequency since Saturday. Pt reports feeling feverish last night, unsure of temp. Pt reports some SOB with exertion and coughing. Pt reports taking mucinex and tylenol  with little to no relief. Pt reports family members have been sick.

## 2024-04-26 NOTE — ED Provider Notes (Signed)
 [facility]    CSN: [id number] Arrival date & time: 04/25/24  1643      History   Chief Complaint Chief Complaint  Patient presents with   Cough    Started Saturday, with a sore throat, now nose running, slight chest pain, ear aches in both ears, pressure in head and hurts to cough. - Entered by patient   Abdominal Pain    HPI Kelsey Abbott is a 28 y.o. female.   Patient presenting today with 3-day history of congestion, cough, fatigue, feeling feverish, chills, sweats.  Denies chest pain, vomiting, diarrhea, rashes.  Multiple sick contacts recently.  Tried Mucinex and Tylenol  with minimal relief.  Also having several days of urinary frequency and lower abdominal pressure and pain.  Denies vaginal discharge, vaginal rashes or lesions, pelvic pain, bowel changes.  Not tried anything for the symptoms.  LMP 04/17/2024.    Past Medical History:  Diagnosis Date   Acne vulgaris 09/29/2014   Allergy    Asthma    mother says asthma was ruled  out.   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   Panic attacks 04/25/2013   Right ACL tear 04/17/2016   SOB (shortness of breath) 03/23/2013    Patient Active Problem List   Diagnosis Date Noted   Acute midline low back pain without sciatica 06/02/2023   Cervical cancer screening 06/02/2023   Encounter for immunization 06/02/2023   Otitis media 07/02/2022   Pharyngitis 04/29/2022   Blurred vision, bilateral 04/25/2022   Vulvovaginal candidiasis 03/24/2022   Chlamydia 05/24/2021   Encounter for initial prescription of injectable contraceptive  05/20/2021   Encounter for annual general medical examination with abnormal findings in adult 05/20/2021   Patient desires pregnancy 09/04/2020   Acute vaginitis 04/06/2020   Need for immunization against influenza 04/06/2020   Throat irritation 01/11/2020   Class 1 obesity due to excess calories without serious comorbidity with body mass index (BMI) of 32.0 to 32.9 in adult 12/29/2019    Past Surgical History:  Procedure Laterality Date   KNEE ARTHROSCOPY WITH ANTERIOR CRUCIATE LIGAMENT (ACL) REPAIR WITH HAMSTRING GRAFT Right 04/17/2016   Procedure: Right KNEE ARTHROSCOPY WITH ANTERIOR CRUCIATE LIGAMENT (ACL) REPAIR WITH HAMSTRING GRAFT;  Surgeon: Fonda Olmsted, MD;  Location: [facility];  Service: Orthopedics;  Laterality: Right;  Pre/Post Op femoral nerve block   KNEE ARTHROSCOPY WITH MEDIAL MENISECTOMY Right 04/17/2016   Procedure: KNEE ARTHROSCOPY WITH PARTIAL MEDIAL MENISECTOMY;  Surgeon: Fonda Olmsted, MD;  Location: [facility];  Service: Orthopedics;  Laterality: Right;   KNEE ARTHROSCOPY WITH MEDIAL PATELLAR FEMORAL LIGAMENT RECONSTRUCTION Right 04/17/2016   Procedure: KNEE ARTHROSCOPY WITH MEDIAL PATELLAR FEMORAL LIGAMENT REEFING;  Surgeon: Fonda Olmsted, MD;  Location: [facility];  Service: Orthopedics;  Laterality: Right;    OB History     Gravida  0   Para  0   Term  0   Preterm  0   AB  0   Living  0      SAB  0   IAB  0  Ectopic  0   Multiple  0   Live Births  0            Home Medications    Prior to Admission medications   Medication Sig Start Date End Date Taking? Authorizing Provider  azelastine  (ASTELIN ) 0.1 % nasal spray Place 1 spray into both nostrils 2 (two) times daily. Use in each nostril as directed 04/25/24  Yes Stuart Vernell Norris, PA-C  cephALEXin  (KEFLEX ) 500 MG capsule Take 1 capsule (500 mg total) by mouth 2 (two) times daily. 04/25/24  Yes Stuart Vernell Norris, PA-C   promethazine -dextromethorphan (PROMETHAZINE -DM) 6.25-15 MG/5ML syrup Take 5 mLs by mouth 4 (four) times daily as needed. 04/25/24  Yes Stuart Vernell Norris, PA-C  methocarbamol  (ROBAXIN -750) 750 MG tablet Take 1 tablet (750 mg total) by mouth every 8 (eight) hours as needed (back pain). Patient not taking: Reported on 04/25/2024 11/19/23   Bevely Doffing, FNP    Family History Family History  Problem Relation Age of Onset   Cancer Mother 48       breast cancer   Hypertension Maternal Grandmother    Hypertension Maternal Aunt     Social History Social History   Tobacco Use   Smoking status: Former    Types: Cigars   Smokeless tobacco: Never  Vaping Use   Vaping status: Never Used  Substance Use Topics   Alcohol use: Yes    Comment: occasionally   Drug use: Yes    Types: Marijuana    Comment: occassionally     Allergies   Patient has no known allergies.   Review of Systems Review of Systems Per HPI  Physical Exam Triage Vital Signs ED Triage Vitals  Encounter Vitals Group     BP 04/25/24 1704 124/83     Girls Systolic BP Percentile --      Girls Diastolic BP Percentile --      Boys Systolic BP Percentile --      Boys Diastolic BP Percentile --      Pulse Rate 04/25/24 1704 (!) 109     Resp 04/25/24 1704 19     Temp 04/25/24 1704 99.2 F (37.3 C)     Temp Source 04/25/24 1704 Oral     SpO2 04/25/24 1704 96 %     Weight --      Height --      Head Circumference --      Peak Flow --      Pain Score 04/25/24 1702 6     Pain Loc --      Pain Education --      Exclude from Growth Chart --    No data found.  Updated Vital Signs BP 124/83 (BP Location: Right Arm)   Pulse (!) 109   Temp 99.2 F (37.3 C) (Oral)   Resp 19   LMP 04/17/2024 (Exact Date)   SpO2 96%   Visual Acuity Right Eye Distance:   Left Eye Distance:   Bilateral Distance:    Right Eye Near:   Left Eye Near:    Bilateral Near:     Physical Exam Vitals and nursing note reviewed.   Constitutional:      Appearance: Normal appearance.  HENT:     Head: Atraumatic.     Right Ear: Tympanic membrane and external ear normal.     Left Ear: Tympanic membrane and external ear normal.     Nose: Rhinorrhea present.     Mouth/Throat:  Mouth: Mucous membranes are moist.     Pharynx: Posterior oropharyngeal erythema present.  Eyes:     Extraocular Movements: Extraocular movements intact.     Conjunctiva/sclera: Conjunctivae normal.  Cardiovascular:     Rate and Rhythm: Normal rate and regular rhythm.     Heart sounds: Normal heart sounds.  Pulmonary:     Effort: Pulmonary effort is normal.     Breath sounds: Normal breath sounds. No wheezing or rales.  Abdominal:     General: Bowel sounds are normal. There is no distension.     Palpations: Abdomen is soft.     Tenderness: There is no abdominal tenderness. There is no right CVA tenderness, left CVA tenderness or guarding.  Musculoskeletal:        General: Normal range of motion.     Cervical back: Normal range of motion and neck supple.  Skin:    General: Skin is warm and dry.  Neurological:     Mental Status: She is alert and oriented to person, place, and time.  Psychiatric:        Mood and Affect: Mood normal.        Thought Content: Thought content normal.      UC Treatments / Results  Labs (all labs ordered are listed, but only abnormal results are displayed) Labs Reviewed  POCT URINE DIPSTICK - Abnormal; Notable for the following components:      Result Value   Clarity, UA cloudy (*)    Ketones, POC UA small (15) (*)    POC PROTEIN,UA =30 (*)    Nitrite, UA Positive (*)    Leukocytes, UA Small (1+) (*)    All other components within normal limits  URINE CULTURE  POC SOFIA SARS ANTIGEN FIA    EKG   Radiology No results found.  Procedures Procedures (including critical care time)  Medications Ordered in UC Medications - No data to display  Initial Impression / Assessment and Plan / UC  Course  I have reviewed the triage vital signs and the nursing notes.  Pertinent labs & imaging results that were available during my care of the patient were reviewed by me and considered in my medical decision making (see chart for details).     Mildly tachycardic in triage otherwise vital signs within normal limits.  She is well-appearing and in no acute distress.  Rapid COVID-negative, suspect other strain of virus causing upper respiratory symptoms.  Treat with Phenergan  DM, Astelin , supportive over-the-counter medications at home care.  Urinalysis today does show evidence of urinary tract infection so we will treat with Keflex  while awaiting urine culture and adjust if needed.  Return for worsening symptoms.  Final Clinical Impressions(s) / UC Diagnoses   Final diagnoses:  Viral URI with cough  Acute lower UTI     Discharge Instructions      Your urine test today does show evidence of urinary tract infection.  I have placed you on some antibiotics for this and send out for a urine culture to get more information.  We will let you know if we need to make any changes to your medication.  For your upper respiratory symptoms, I have sent in some nasal spray and cough syrup and you may take over-the-counter cold and congestion medications as needed.  Follow-up for worsening symptoms.    ED Prescriptions     Medication Sig Dispense Auth. Provider   azelastine  (ASTELIN ) 0.1 % nasal spray Place 1 spray into both nostrils 2 (two)  times daily. Use in each nostril as directed 30 mL Stuart Vernell Norris, PA-C   promethazine -dextromethorphan (PROMETHAZINE -DM) 6.25-15 MG/5ML syrup Take 5 mLs by mouth 4 (four) times daily as needed. 100 mL Stuart Vernell Norris, PA-C   cephALEXin  (KEFLEX ) 500 MG capsule Take 1 capsule (500 mg total) by mouth 2 (two) times daily. 10 capsule Stuart Vernell Norris, [place]      PDMP not reviewed this encounter.   Stuart Vernell Norris, PA-C 04/26/24  1649

## 2024-04-27 LAB — URINE CULTURE: Culture: >=100000 — AB

## 2024-04-28 ENCOUNTER — Ambulatory Visit (HOSPITAL_COMMUNITY): Payer: Self-pay

## 2024-05-31 ENCOUNTER — Ambulatory Visit: Payer: Self-pay | Admitting: Internal Medicine

## 2024-05-31 VITALS — BP 121/81 | HR 83 | Ht 63.0 in | Wt 222.8 lb

## 2024-05-31 DIAGNOSIS — Z319 Encounter for procreative management, unspecified: Secondary | ICD-10-CM | POA: Insufficient documentation

## 2024-05-31 DIAGNOSIS — L304 Erythema intertrigo: Secondary | ICD-10-CM | POA: Insufficient documentation

## 2024-05-31 DIAGNOSIS — Z23 Encounter for immunization: Secondary | ICD-10-CM

## 2024-05-31 MED ORDER — CLOTRIMAZOLE-BETAMETHASONE 1-0.05 % EX CREA
1.0000 | TOPICAL_CREAM | Freq: Every day | CUTANEOUS | 0 refills | Status: AC
Start: 2024-05-31 — End: ?

## 2024-05-31 NOTE — Patient Instructions (Signed)
 Please apply Lotrisone cream for rash.  You are being referred to Ob.Gyn.

## 2024-05-31 NOTE — Assessment & Plan Note (Addendum)
 Started Lotrisone cream for now Likely has some component of allergy as well Advised to change her deodorant to the previous one Keep area clean and dry

## 2024-05-31 NOTE — Progress Notes (Signed)
 Acute Office Visit  Subjective:    Patient ID: Kelsey Abbott, female    DOB: 04-15-1996, 28 y.o.   MRN: [id number]  Chief Complaint  Patient presents with   fertility concerns    Pt reports fertility concerns. Would like a referral to gyn.   Skin Problem    Has redness and swelling under her right arm.     HPI Patient is in today for complaint of rash in the right axillary area for the last 2 days.  She has noticed redness and itching.  She had started using a new deodorant about a week ago.  Does not report any recent change in soap, shampoo, lotion or cleaning supplies.  She also requests referral to infertility specialist.  She and her partner have been trying to conceive for about 3 years.  She has regular menstrual cycles.   Past Medical History:  Diagnosis Date   Acne vulgaris 09/29/2014   Allergy    Asthma    mother says asthma was ruled  out.   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   Panic attacks 04/25/2013   Right ACL tear 04/17/2016   SOB (shortness of breath) 03/23/2013    Past Surgical History:  Procedure Laterality Date   KNEE ARTHROSCOPY WITH ANTERIOR CRUCIATE LIGAMENT (ACL) REPAIR WITH HAMSTRING GRAFT Right 04/17/2016   Procedure: Right KNEE ARTHROSCOPY WITH ANTERIOR CRUCIATE LIGAMENT (ACL) REPAIR WITH HAMSTRING GRAFT;  Surgeon: Fonda Olmsted, MD;  Location: [facility];  Service: Orthopedics;  Laterality: Right;  Pre/Post Op femoral nerve block   KNEE ARTHROSCOPY WITH MEDIAL MENISECTOMY Right 04/17/2016   Procedure: KNEE ARTHROSCOPY WITH PARTIAL MEDIAL MENISECTOMY;   Surgeon: Fonda Olmsted, MD;  Location: [facility];  Service: Orthopedics;  Laterality: Right;   KNEE ARTHROSCOPY WITH MEDIAL PATELLAR FEMORAL LIGAMENT RECONSTRUCTION Right 04/17/2016   Procedure: KNEE ARTHROSCOPY WITH MEDIAL PATELLAR FEMORAL LIGAMENT REEFING;  Surgeon: Fonda Olmsted, MD;  Location:  SURGERY CENTER;  Service: Orthopedics;  Laterality: Right;    Family History  Problem Relation Age of Onset   Cancer Mother 37       breast cancer   Hypertension Maternal Grandmother    Hypertension Maternal Aunt     Social History   Socioeconomic History   Marital status: Single    Spouse name: Not on file   Number of children: Not on file   Years of education: Not on file   Highest education level: 12th grade  Occupational History   Not on file  Tobacco Use   Smoking status: Former    Types: Cigars   Smokeless tobacco: Never  Vaping Use   Vaping status: Never Used  Substance and Sexual Activity   Alcohol use: Yes    Comment: occasionally   Drug use: Yes    Types: Marijuana    Comment: occassionally   Sexual activity: Yes    Birth control/protection: None  Other Topics Concern   Not on file  Social History Narrative   Lives with cammie Moose       Enjoys: chill  Diet: eats all food outside seafood,    Caffeine : limited to none   Water: 6-8 cups daily      Wears seat belt   Does not use phone while driving    Smoke detectors at home   No guns       Social Drivers of Health   Financial Resource Strain: Medium Risk (05/31/2024)   Overall Financial Resource Strain (CARDIA)    Difficulty of Paying Living Expenses: Somewhat hard  Food Insecurity: No Food Insecurity (05/31/2024)   Hunger Vital Sign    Worried About Running Out of Food in the Last Year: Never true    Ran Out of Food in the Last Year: Never true  Transportation Needs: No Transportation Needs (05/31/2024)   PRAPARE - Administrator, Civil Service (Medical): No     Lack of Transportation (Non-Medical): No  Physical Activity: Sufficiently Active (05/31/2024)   Exercise Vital Sign    Days of Exercise per Week: 5 days    Minutes of Exercise per Session: 30 min  Stress: No Stress Concern Present (05/31/2024)   [employer] of Occupational Health - Occupational Stress Questionnaire    Feeling of Stress: Not at all  Social Connections: Unknown (05/31/2024)   Social Connection and Isolation Panel    Frequency of Communication with Friends and Family: Three times a week    Frequency of Social Gatherings with Friends and Family: Once a week    Attends Religious Services: 1 to 4 times per year    Active Member of [employer] or Organizations: No    Attends Engineer, structural: Not on file    Marital Status: Patient declined  Intimate Partner Violence: Not At Risk (11/19/2023)   Humiliation, Afraid, Rape, and Kick questionnaire    Fear of Current or Ex-Partner: No    Emotionally Abused: No    Physically Abused: No    Sexually Abused: No    Outpatient Medications Prior to Visit  Medication Sig Dispense Refill   azelastine  (ASTELIN ) 0.1 % nasal spray Place 1 spray into both nostrils 2 (two) times daily. Use in each nostril as directed 30 mL 0   cephALEXin  (KEFLEX ) 500 MG capsule Take 1 capsule (500 mg total) by mouth 2 (two) times daily. 10 capsule 0   methocarbamol  (ROBAXIN -750) 750 MG tablet Take 1 tablet (750 mg total) by mouth every 8 (eight) hours as needed (back pain). 30 tablet 2   promethazine -dextromethorphan (PROMETHAZINE -DM) 6.25-15 MG/5ML syrup Take 5 mLs by mouth 4 (four) times daily as needed. 100 mL 0   No facility-administered medications prior to visit.    No Known Allergies  Review of Systems  Constitutional:  Negative for chills and fever.  HENT:  Negative for congestion, sinus pressure, sinus pain and sore throat.   Eyes:  Negative for pain and discharge.  Respiratory:  Negative for cough and shortness of breath.    Cardiovascular:  Negative for chest pain and palpitations.  Gastrointestinal:  Negative for abdominal pain, diarrhea, nausea and vomiting.  Endocrine: Negative for polydipsia and polyuria.  Genitourinary:  Negative for dysuria and hematuria.  Musculoskeletal:  Negative for neck pain and neck stiffness.  Skin:  Positive for rash.  Neurological:  Negative for dizziness and weakness.  Psychiatric/Behavioral:  Negative for agitation and behavioral problems.        Objective:    Physical Exam Vitals reviewed.  Constitutional:      General: She is not in acute distress.  Appearance: She is not diaphoretic.  HENT:     Head: Normocephalic and atraumatic.     Nose: Nose normal.     Mouth/Throat:     Mouth: Mucous membranes are moist.  Eyes:     General: No scleral icterus.    Extraocular Movements: Extraocular movements intact.  Cardiovascular:     Rate and Rhythm: Normal rate and regular rhythm.     Heart sounds: Normal heart sounds. No murmur heard. Pulmonary:     Breath sounds: Normal breath sounds. No wheezing or rales.  Musculoskeletal:     Cervical back: Neck supple. No tenderness.     Right lower leg: No edema.     Left lower leg: No edema.  Skin:    General: Skin is warm.     Findings: Rash (Erythematous rash in right axilla) present.  Neurological:     General: No focal deficit present.     Mental Status: She is alert and oriented to person, place, and time.  Psychiatric:        Mood and Affect: Mood normal.        Behavior: Behavior normal.     BP 121/81   Pulse 83   Ht 5' 3 (1.6 m)   Wt 222 lb 12.8 oz (101.1 kg)   SpO2 100%   BMI 39.47 kg/m  Wt Readings from Last 3 Encounters:  05/31/24 222 lb 12.8 oz (101.1 kg)  11/19/23 218 lb 1.3 oz (98.9 kg)  06/02/23 214 lb 1.9 oz (97.1 kg)        Assessment & Plan:   Problem List Items Addressed This Visit       Musculoskeletal and Integument   Intertrigo - Primary   Started Lotrisone cream for  now Likely has some component of allergy as well Advised to change her deodorant to the previous one Keep area clean and dry      Relevant Medications   clotrimazole-betamethasone (LOTRISONE) cream     Other   Infertility management   Reports regular menstrual cycles Does not smoke Referred to OB/GYN for further management      Relevant Orders   Ambulatory referral to Obstetrics / Gynecology     Meds ordered this encounter  Medications   clotrimazole-betamethasone (LOTRISONE) cream    Sig: Apply 1 Application topically daily.    Dispense:  30 g    Refill:  0     Clydene Burack MARLA Blanch, MD

## 2024-05-31 NOTE — Assessment & Plan Note (Addendum)
 Reports regular menstrual cycles Does not smoke Referred to OB/GYN for further management

## 2024-07-21 ENCOUNTER — Encounter (HOSPITAL_BASED_OUTPATIENT_CLINIC_OR_DEPARTMENT_OTHER): Payer: Self-pay

## 2024-07-21 ENCOUNTER — Encounter (HOSPITAL_BASED_OUTPATIENT_CLINIC_OR_DEPARTMENT_OTHER): Payer: Self-pay | Admitting: Obstetrics and Gynecology
